# Patient Record
Sex: Male | Born: 1990 | Race: Black or African American | Hispanic: No | Marital: Single | Smoking: Former smoker
Health system: Southern US, Community
[De-identification: ages and names within clinical notes are randomized; demographics above are authoritative.]

## PROBLEM LIST (undated history)

## (undated) DIAGNOSIS — R1115 Cyclical vomiting syndrome unrelated to migraine: Secondary | ICD-10-CM

## (undated) DIAGNOSIS — J020 Streptococcal pharyngitis: Secondary | ICD-10-CM

---

## 2012-08-13 DIAGNOSIS — F1210 Cannabis abuse, uncomplicated: Secondary | ICD-10-CM | POA: Insufficient documentation

## 2012-09-13 DIAGNOSIS — F29 Unspecified psychosis not due to a substance or known physiological condition: Secondary | ICD-10-CM | POA: Insufficient documentation

## 2012-09-13 DIAGNOSIS — F411 Generalized anxiety disorder: Secondary | ICD-10-CM | POA: Insufficient documentation

## 2013-04-27 DIAGNOSIS — R1115 Cyclical vomiting syndrome unrelated to migraine: Secondary | ICD-10-CM | POA: Insufficient documentation

## 2013-05-08 DIAGNOSIS — R1115 Cyclical vomiting syndrome unrelated to migraine: Secondary | ICD-10-CM | POA: Insufficient documentation

## 2013-12-11 ENCOUNTER — Emergency Department: Payer: Self-pay | Admitting: Emergency Medicine

## 2013-12-11 LAB — COMPREHENSIVE METABOLIC PANEL
ANION GAP: 10 (ref 7–16)
Albumin: 4.3 g/dL (ref 3.4–5.0)
Alkaline Phosphatase: 74 U/L
BILIRUBIN TOTAL: 0.5 mg/dL (ref 0.2–1.0)
BUN: 5 mg/dL — AB (ref 7–18)
CO2: 21 mmol/L (ref 21–32)
Calcium, Total: 9.5 mg/dL (ref 8.5–10.1)
Chloride: 107 mmol/L (ref 98–107)
Creatinine: 0.72 mg/dL (ref 0.60–1.30)
EGFR (African American): >60
GLUCOSE: 109 mg/dL — AB (ref 65–99)
Osmolality: 274 (ref 275–301)
POTASSIUM: 3.8 mmol/L (ref 3.5–5.1)
SGOT(AST): 42 U/L — ABNORMAL HIGH (ref 15–37)
SGPT (ALT): 37 U/L (ref 12–78)
SODIUM: 138 mmol/L (ref 136–145)
Total Protein: 8.6 g/dL — ABNORMAL HIGH (ref 6.4–8.2)

## 2013-12-11 LAB — CBC WITH DIFFERENTIAL/PLATELET
Basophil #: 0.1 10*3/uL (ref 0.0–0.1)
Basophil %: 0.8 %
EOS ABS: 0.1 10*3/uL (ref 0.0–0.7)
Eosinophil %: 0.8 %
HCT: 44.8 % (ref 40.0–52.0)
HGB: 15.2 g/dL (ref 13.0–18.0)
LYMPHS ABS: 1.3 10*3/uL (ref 1.0–3.6)
LYMPHS PCT: 18.8 %
MCH: 29.8 pg (ref 26.0–34.0)
MCHC: 33.9 g/dL (ref 32.0–36.0)
MCV: 88 fL (ref 80–100)
Monocyte #: 0.5 x10 3/mm (ref 0.2–1.0)
Monocyte %: 6.9 %
NEUTROS PCT: 72.7 %
Neutrophil #: 4.9 10*3/uL (ref 1.4–6.5)
PLATELETS: 273 10*3/uL (ref 150–440)
RBC: 5.1 10*6/uL (ref 4.40–5.90)
RDW: 13.1 % (ref 11.5–14.5)
WBC: 6.7 10*3/uL (ref 3.8–10.6)

## 2013-12-11 LAB — TROPONIN I

## 2013-12-11 LAB — LIPASE, BLOOD: LIPASE: 138 U/L (ref 73–393)

## 2013-12-19 DIAGNOSIS — R0789 Other chest pain: Secondary | ICD-10-CM | POA: Insufficient documentation

## 2014-09-23 ENCOUNTER — Emergency Department: Payer: Self-pay | Admitting: Emergency Medicine

## 2016-04-16 ENCOUNTER — Encounter (HOSPITAL_COMMUNITY): Payer: Self-pay | Admitting: Emergency Medicine

## 2016-04-16 DIAGNOSIS — E876 Hypokalemia: Secondary | ICD-10-CM | POA: Insufficient documentation

## 2016-04-16 DIAGNOSIS — K920 Hematemesis: Secondary | ICD-10-CM | POA: Insufficient documentation

## 2016-04-16 NOTE — ED Triage Notes (Signed)
Pt. Reports central chest and generalized abdominal pain with emesis and constipation onset last week , denies SOB or diarrhea .

## 2016-04-17 ENCOUNTER — Emergency Department (HOSPITAL_COMMUNITY)
Admission: EM | Admit: 2016-04-17 | Discharge: 2016-04-17 | Disposition: A | Discharge status: Home / Self Care | Payer: Self-pay | Attending: Emergency Medicine | Admitting: Emergency Medicine

## 2016-04-17 ENCOUNTER — Emergency Department (HOSPITAL_COMMUNITY): Payer: Self-pay

## 2016-04-17 ENCOUNTER — Encounter (HOSPITAL_COMMUNITY): Payer: Self-pay | Admitting: Radiology

## 2016-04-17 DIAGNOSIS — R1084 Generalized abdominal pain: Secondary | ICD-10-CM

## 2016-04-17 DIAGNOSIS — E876 Hypokalemia: Secondary | ICD-10-CM

## 2016-04-17 DIAGNOSIS — R112 Nausea with vomiting, unspecified: Secondary | ICD-10-CM

## 2016-04-17 DIAGNOSIS — K920 Hematemesis: Secondary | ICD-10-CM

## 2016-04-17 LAB — URINALYSIS, ROUTINE W REFLEX MICROSCOPIC
Glucose, UA: NEGATIVE mg/dL
Ketones, ur: >80 mg/dL — AB
Nitrite: NEGATIVE
Protein, ur: 100 mg/dL — AB
Specific Gravity, Urine: 1.028 (ref 1.005–1.030)
pH: 6.5 (ref 5.0–8.0)

## 2016-04-17 LAB — CBC
HCT: 46.1 % (ref 39.0–52.0)
Hemoglobin: 16.2 g/dL (ref 13.0–17.0)
MCH: 29.8 pg (ref 26.0–34.0)
MCHC: 35.1 g/dL (ref 30.0–36.0)
MCV: 84.7 fL (ref 78.0–100.0)
Platelets: 501 10*3/uL — ABNORMAL HIGH (ref 150–400)
RBC: 5.44 MIL/uL (ref 4.22–5.81)
RDW: 12.3 % (ref 11.5–15.5)
WBC: 15.9 10*3/uL — ABNORMAL HIGH (ref 4.0–10.5)

## 2016-04-17 LAB — BASIC METABOLIC PANEL
Anion gap: 16 — ABNORMAL HIGH (ref 5–15)
BUN: 14 mg/dL (ref 6–20)
CO2: 25 mmol/L (ref 22–32)
Calcium: 10.2 mg/dL (ref 8.9–10.3)
Chloride: 91 mmol/L — ABNORMAL LOW (ref 101–111)
Creatinine, Ser: 1.07 mg/dL (ref 0.61–1.24)
GFR calc Af Amer: >60 mL/min (ref >60)
GFR calc non Af Amer: >60 mL/min (ref >60)
Glucose, Bld: 101 mg/dL — ABNORMAL HIGH (ref 65–99)
Potassium: 2.9 mmol/L — ABNORMAL LOW (ref 3.5–5.1)
Sodium: 132 mmol/L — ABNORMAL LOW (ref 135–145)

## 2016-04-17 LAB — LIPASE, BLOOD: Lipase: 36 U/L (ref 11–51)

## 2016-04-17 LAB — URINE MICROSCOPIC-ADD ON

## 2016-04-17 LAB — I-STAT TROPONIN, ED: Troponin i, poc: 0 ng/mL (ref 0.00–0.08)

## 2016-04-17 MED ORDER — DIPHENHYDRAMINE HCL 50 MG/ML IJ SOLN
25.0000 mg | Freq: Once | INTRAMUSCULAR | Status: AC
  Administered 2016-04-17: 25 mg via INTRAVENOUS
  Filled 2016-04-17: qty 1

## 2016-04-17 MED ORDER — RANITIDINE HCL 150 MG PO TABS: 150.0000 mg | ORAL_TABLET | Freq: Two times a day (BID) | ORAL | 0 refills | Status: DC

## 2016-04-17 MED ORDER — IOPAMIDOL (ISOVUE-300) INJECTION 61%
INTRAVENOUS | Status: AC
  Administered 2016-04-17: 100 mL
  Filled 2016-04-17: qty 100

## 2016-04-17 MED ORDER — SODIUM CHLORIDE 0.9 % IV BOLUS (SEPSIS)
1000.0000 mL | Freq: Once | INTRAVENOUS | Status: AC
  Administered 2016-04-17: 1000 mL via INTRAVENOUS

## 2016-04-17 MED ORDER — DOCUSATE SODIUM 100 MG PO CAPS: 100.0000 mg | ORAL_CAPSULE | Freq: Two times a day (BID) | ORAL | 0 refills | Status: DC

## 2016-04-17 MED ORDER — METOCLOPRAMIDE HCL 5 MG/ML IJ SOLN
10.0000 mg | Freq: Once | INTRAMUSCULAR | Status: AC
  Administered 2016-04-17: 10 mg via INTRAVENOUS
  Filled 2016-04-17: qty 2

## 2016-04-17 MED ORDER — POTASSIUM CHLORIDE CRYS ER 20 MEQ PO TBCR: 40.0000 meq | EXTENDED_RELEASE_TABLET | Freq: Every day | ORAL | 0 refills | Status: DC

## 2016-04-17 MED ORDER — PROMETHAZINE HCL 25 MG PO TABS: 25.0000 mg | ORAL_TABLET | Freq: Four times a day (QID) | ORAL | 0 refills | Status: DC | PRN

## 2016-04-17 NOTE — ED Notes (Signed)
Pt provided with d/c instructions at this time.  Pt verbalizes understanding of d/c instructions as well as follow up procedure after d/c.  Pt provided with RX for zantac, phenergan, colace, and potassium chloride.  Pt verbalizes understanding of RX directions. Pt in no apparent distress at this time.  Pt ambulatory at time of d/c.

## 2016-04-17 NOTE — ED Provider Notes (Signed)
[facility]-EMERGENCY DEPT Provider Note   CSN: [id number] Arrival date & time: 04/16/16  2333  By signing my name below, I, Clovis PuAvnee Patel, attest that this documentation has been prepared under the direction and in the presence of Gilda Creasehristopher J Lucero Ide, MD  Electronically Signed: Clovis PuAvnee Patel, ED Scribe. 04/17/16. 1:13 AM.    History   Chief Complaint Chief Complaint  Patient presents with  . Chest Pain  . Abdominal Pain    The history is provided by the patient. No language interpreter was used.    HPI Comments:  James Sanchez is a 25 y.o. male who presents to the Emergency Department complaining of constant, gradual worsening, moderate generalized abdominal pain onset one week ago. Pt has associated left sided chest pain, nausea, vomiting and diarrhea. Pt further reports hematemesis and constipation. Per pt, he is unable to eat or drink any fluids without vomiting. His mother reports giving him an enema to help with his constipation that has not given him any relief. She notes pt has a hx of abdominal problems in the past. He has no other complaints at this time. No alleviating factors noted.    History reviewed. No pertinent past medical history.  There are no active problems to display for this patient.   History reviewed. No pertinent surgical history.     Home Medications    Prior to Admission medications   Medication Sig Start Date End Date Taking? Authorizing Provider  docusate sodium (COLACE) 100 MG capsule Take 1 capsule (100 mg total) by mouth every 12 (twelve) hours. 04/17/16   Gilda Creasehristopher J Daimion Adamcik, MD  potassium chloride SA (K-DUR,KLOR-CON) 20 MEQ tablet Take 2 tablets (40 mEq total) by mouth daily. 04/17/16   Gilda Creasehristopher J Garrus Gauthreaux, MD  promethazine (PHENERGAN) 25 MG tablet Take 1 tablet (25 mg total) by mouth every 6 (six) hours as needed for nausea or vomiting. 04/17/16   Gilda Creasehristopher J Douglas Smolinsky, MD  ranitidine (ZANTAC) 150 MG tablet Take 1 tablet (150 mg total) by mouth 2  (two) times daily. 04/17/16   Gilda Creasehristopher J Maan Zarcone, MD    Family History No family history on file.  Social History Social History  Substance Use Topics  . Smoking status: Never Smoker  . Smokeless tobacco: Never Used  . Alcohol use No     Allergies   Aspirin and Sulfa antibiotics   Review of Systems Review of Systems  Cardiovascular: Positive for chest pain.  Gastrointestinal: Positive for abdominal pain, diarrhea and vomiting.  All other systems reviewed and are negative.    Physical Exam Updated Vital Signs BP 139/98 (BP Location: Right Arm)   Pulse 94   Temp 99.6 F (37.6 C) (Oral)   Resp 16   Ht 5\' 8"  (1.727 m)   Wt 251 lb (113.9 kg)   SpO2 97%   BMI 38.16 kg/m   Physical Exam  Constitutional: He is oriented to person, place, and time. He appears well-developed and well-nourished. No distress.  HENT:  Head: Normocephalic and atraumatic.  Right Ear: Hearing normal.  Left Ear: Hearing normal.  Nose: Nose normal.  Mouth/Throat: Oropharynx is clear and moist and mucous membranes are normal.  Eyes: Conjunctivae and EOM are normal. Pupils are equal, round, and reactive to light.  Neck: Normal range of motion. Neck supple.  Cardiovascular: Regular rhythm, S1 normal and S2 normal.  Exam reveals no gallop and no friction rub.   No murmur heard. Pulmonary/Chest: Effort normal and breath sounds normal. No respiratory distress. He exhibits no tenderness.  Abdominal: Soft. Normal appearance and bowel sounds are normal. There is no hepatosplenomegaly. There is tenderness. There is no rebound, no guarding, no tenderness at McBurney's point and negative Murphy's sign. No hernia.  Diffuse tenderness   Musculoskeletal: Normal range of motion.  Neurological: He is alert and oriented to person, place, and time. He has normal strength. No cranial nerve deficit or sensory deficit. Coordination normal. GCS eye subscore is 4. GCS verbal subscore is 5. GCS motor subscore is 6.    Skin: Skin is warm, dry and intact. No rash noted. No cyanosis.  Psychiatric: He has a normal mood and affect. His speech is normal and behavior is normal. Thought content normal.  Nursing note and vitals reviewed.    ED Treatments / Results  DIAGNOSTIC STUDIES:  Oxygen Saturation is 97% on room air, normal by my interpretation.    COORDINATION OF CARE:  1:05 AM Discussed treatment plan with pt at bedside and pt agreed to plan.  Labs (all labs ordered are listed, but only abnormal results are displayed) Labs Reviewed  BASIC METABOLIC PANEL - Abnormal; Notable for the following:       Result Value   Sodium 132 (*)    Potassium 2.9 (*)    Chloride 91 (*)    Glucose, Bld 101 (*)    Anion gap 16 (*)    All other components within normal limits  CBC - Abnormal; Notable for the following:    WBC 15.9 (*)    Platelets 501 (*)    All other components within normal limits  URINALYSIS, ROUTINE W REFLEX MICROSCOPIC (NOT AT [facility]) - Abnormal; Notable for the following:    Color, Urine AMBER (*)    APPearance CLOUDY (*)    Hgb urine dipstick TRACE (*)    Bilirubin Urine MODERATE (*)    Ketones, ur >80 (*)    Protein, ur 100 (*)    Leukocytes, UA SMALL (*)    All other components within normal limits  URINE MICROSCOPIC-ADD ON - Abnormal; Notable for the following:    Squamous Epithelial / LPF 6-30 (*)    Bacteria, UA FEW (*)    Casts HYALINE CASTS (*)    All other components within normal limits  LIPASE, BLOOD  I-STAT TROPOININ, ED    EKG  EKG Interpretation None       Radiology Dg Chest 2 View  Result Date: 04/17/2016 CLINICAL DATA:  Acute onset of mid chest pain, nausea and vomiting. Body aches and cough. Initial encounter. EXAM: CHEST  2 VIEW COMPARISON:  None. FINDINGS: The lungs are well-aerated and clear. There is no evidence of focal opacification, pleural effusion or pneumothorax. The heart is normal in size; the mediastinal contour is within normal limits. No  acute osseous abnormalities are seen. IMPRESSION: No acute cardiopulmonary process seen. Electronically Signed   By: Roanna Raider M.D.   On: 04/17/2016 00:12   Ct Abdomen Pelvis W Contrast  Result Date: 04/17/2016 CLINICAL DATA:  25 year old male with abdominal pain. EXAM: CT ABDOMEN AND PELVIS WITH CONTRAST TECHNIQUE: Multidetector CT imaging of the abdomen and pelvis was performed using the standard protocol following bolus administration of intravenous contrast. CONTRAST:  ISOVUE-300 IOPAMIDOL (ISOVUE-300) INJECTION 61% COMPARISON:  Abdominal radiograph dated 12/11/2013 FINDINGS: Evaluation of this exam is limited due to respiratory motion artifact. The visualized lung bases are clear. No intra-abdominal free air or free fluid. The liver, gallbladder, pancreas, spleen, adrenal glands, kidneys, visualized ureters, and urinary bladder appear unremarkable. The  prostate and seminal vesicles are grossly unremarkable. There is no evidence of bowel obstruction or active inflammation. The appendix is not visualized with certainty. No inflammatory changes identified in the right lower quadrant. The abdominal aorta and IVC appear unremarkable. No portal venous gas identified. There is no adenopathy. The abdominal wall soft tissues appear unremarkable. Bilateral L5 pars defects. No listhesis. No acute osseous pathology. IMPRESSION: No acute intra-abdominal pelvic pathology. Electronically Signed   By: Elgie Collard M.D.   On: 04/17/2016 02:21    Procedures Procedures (including critical care time)  Medications Ordered in ED Medications  sodium chloride 0.9 % bolus 1,000 mL (1,000 mLs Intravenous New Bag/Given 04/17/16 0130)    Followed by  sodium chloride 0.9 % bolus 1,000 mL (1,000 mLs Intravenous New Bag/Given 04/17/16 0131)  metoCLOPramide (REGLAN) injection 10 mg (10 mg Intravenous Given 04/17/16 0130)  diphenhydrAMINE (BENADRYL) injection 25 mg (25 mg Intravenous Given 04/17/16 0130)  iopamidol  (ISOVUE-300) 61 % injection (100 mLs  Contrast Given 04/17/16 0151)     Initial Impression / Assessment and Plan / ED Course  I have reviewed the triage vital signs and the nursing notes.  Pertinent labs & imaging results that were available during my care of the patient were reviewed by me and considered in my medical decision making (see chart for details).  Clinical Course    Patient presents with complaints of nausea and vomiting for 1 week. He reports that he has noticed blood in his vomit. He has not been able to hold anything down because of nausea and vomiting. He does feel like he is constipated, has been attempting enemas without improvement.  Laboratory reveals leukocytosis and mild hypokalemia. There was a very slight anion gap present. His abdominal exam, however, was fairly benign. He had tenderness diffusely, but there was no signs of acute peritonitis. Because of his diffuse pain as well as elevated white blood cell count, CT scan with contrast was performed. No acute abnormality is noted. Patient was aggressively hydrated here in the ER. He'll require potassium supplementation and symptomatically treatment, follow-up with GI. He reports that he did notice some blood in his vomit. This was likely secondary to history of a healing, as he is hemodynamically stable and his hemoglobin is normal. Will prescribe H2 blocker.  Final Clinical Impressions(s) / ED Diagnoses   Final diagnoses:  Generalized abdominal pain  Nausea and vomiting, vomiting of unspecified type  Hematemesis with nausea  Hypokalemia    New Prescriptions New Prescriptions   DOCUSATE SODIUM (COLACE) 100 MG CAPSULE    Take 1 capsule (100 mg total) by mouth every 12 (twelve) hours.   POTASSIUM CHLORIDE SA (K-DUR,KLOR-CON) 20 MEQ TABLET    Take 2 tablets (40 mEq total) by mouth daily.   PROMETHAZINE (PHENERGAN) 25 MG TABLET    Take 1 tablet (25 mg total) by mouth every 6 (six) hours as needed for nausea or  vomiting.   RANITIDINE (ZANTAC) 150 MG TABLET    Take 1 tablet (150 mg total) by mouth 2 (two) times daily.  I personally performed the services described in this documentation, which was scribed in my presence. The recorded information has been reviewed and is accurate.     Gilda Crease, MD 04/17/16 [phone]

## 2016-04-21 LAB — HM HIV SCREENING LAB: HM HIV Screening: NEGATIVE

## 2016-04-26 ENCOUNTER — Emergency Department
Admission: EM | Admit: 2016-04-26 | Discharge: 2016-04-26 | Disposition: A | Discharge status: Home / Self Care | Payer: Self-pay | Attending: Emergency Medicine | Admitting: Emergency Medicine

## 2016-04-26 ENCOUNTER — Encounter: Payer: Self-pay | Admitting: Emergency Medicine

## 2016-04-26 ENCOUNTER — Emergency Department: Payer: Self-pay

## 2016-04-26 DIAGNOSIS — R109 Unspecified abdominal pain: Secondary | ICD-10-CM | POA: Insufficient documentation

## 2016-04-26 DIAGNOSIS — R112 Nausea with vomiting, unspecified: Secondary | ICD-10-CM | POA: Insufficient documentation

## 2016-04-26 DIAGNOSIS — Z5181 Encounter for therapeutic drug level monitoring: Secondary | ICD-10-CM | POA: Insufficient documentation

## 2016-04-26 LAB — URINE DRUG SCREEN, QUALITATIVE (ARMC ONLY)
AMPHETAMINES, UR SCREEN: NOT DETECTED
BARBITURATES, UR SCREEN: NOT DETECTED
BENZODIAZEPINE, UR SCRN: NOT DETECTED
COCAINE METABOLITE, UR ~~LOC~~: NOT DETECTED
Cannabinoid 50 Ng, Ur ~~LOC~~: POSITIVE — AB
MDMA (Ecstasy)Ur Screen: NOT DETECTED
Methadone Scn, Ur: NOT DETECTED
OPIATE, UR SCREEN: NOT DETECTED
PHENCYCLIDINE (PCP) UR S: NOT DETECTED
Tricyclic, Ur Screen: NOT DETECTED

## 2016-04-26 LAB — URINALYSIS COMPLETE WITH MICROSCOPIC (ARMC ONLY)
Glucose, UA: NEGATIVE mg/dL
HGB URINE DIPSTICK: NEGATIVE
Nitrite: NEGATIVE
PH: 6 (ref 5.0–8.0)
Protein, ur: 100 mg/dL — AB
Specific Gravity, Urine: 1.027 (ref 1.005–1.030)

## 2016-04-26 LAB — BASIC METABOLIC PANEL
ANION GAP: 12 (ref 5–15)
BUN: 9 mg/dL (ref 6–20)
CALCIUM: 9.8 mg/dL (ref 8.9–10.3)
CO2: 23 mmol/L (ref 22–32)
CREATININE: 1.08 mg/dL (ref 0.61–1.24)
Chloride: 98 mmol/L — ABNORMAL LOW (ref 101–111)
Glucose, Bld: 91 mg/dL (ref 65–99)
Potassium: 3.3 mmol/L — ABNORMAL LOW (ref 3.5–5.1)
SODIUM: 133 mmol/L — AB (ref 135–145)

## 2016-04-26 LAB — CBC
HCT: 43.3 % (ref 40.0–52.0)
HEMOGLOBIN: 15.3 g/dL (ref 13.0–18.0)
MCH: 29.5 pg (ref 26.0–34.0)
MCHC: 35.3 g/dL (ref 32.0–36.0)
MCV: 83.6 fL (ref 80.0–100.0)
PLATELETS: 400 10*3/uL (ref 150–440)
RBC: 5.18 MIL/uL (ref 4.40–5.90)
RDW: 12.9 % (ref 11.5–14.5)
WBC: 9 10*3/uL (ref 3.8–10.6)

## 2016-04-26 LAB — TROPONIN I: Troponin I: <0.03 ng/mL (ref <0.03)

## 2016-04-26 LAB — LIPASE, BLOOD: LIPASE: 28 U/L (ref 11–51)

## 2016-04-26 MED ORDER — ONDANSETRON HCL 4 MG/2ML IJ SOLN
INTRAMUSCULAR | Status: AC
  Filled 2016-04-26: qty 2

## 2016-04-26 MED ORDER — SODIUM CHLORIDE 0.9 % IV BOLUS (SEPSIS)
1000.0000 mL | Freq: Once | INTRAVENOUS | Status: AC
  Administered 2016-04-26: 1000 mL via INTRAVENOUS
  Filled 2016-04-26: qty 1000

## 2016-04-26 MED ORDER — ONDANSETRON HCL 4 MG PO TABS: 4.0000 mg | ORAL_TABLET | Freq: Three times a day (TID) | ORAL | 0 refills | Status: DC | PRN

## 2016-04-26 MED ORDER — METOCLOPRAMIDE HCL 5 MG/ML IJ SOLN
10.0000 mg | Freq: Once | INTRAMUSCULAR | Status: AC
  Administered 2016-04-26: 10 mg via INTRAVENOUS
  Filled 2016-04-26: qty 2

## 2016-04-26 MED ORDER — LORAZEPAM 1 MG PO TABS: 1.0000 mg | ORAL_TABLET | Freq: Two times a day (BID) | ORAL | 0 refills | Status: DC | PRN

## 2016-04-26 MED ORDER — ONDANSETRON HCL 4 MG/2ML IJ SOLN
4.0000 mg | Freq: Once | INTRAMUSCULAR | Status: AC | PRN
  Administered 2016-04-26: 4 mg via INTRAVENOUS

## 2016-04-26 MED ORDER — LORAZEPAM 2 MG/ML IJ SOLN
1.0000 mg | Freq: Once | INTRAMUSCULAR | Status: AC
  Administered 2016-04-26: 1 mg via INTRAVENOUS
  Filled 2016-04-26: qty 1

## 2016-04-26 NOTE — ED Provider Notes (Signed)
Time Seen: Approximately 0153 I have reviewed the triage notes  Chief Complaint: Chest Pain and Emesis   History of Present Illness: James Sanchez is a 25 y.o. male who states a history recently of chest pain with vomiting over the last 3 weeks. Patient minutes has been seen at numerous local hospitals. He apparently is also had this issue in the past where he gets persistent vomiting. No obvious new complaints and most of his history and review of systems is taken through his mother as the patient will refuse to answer questions when addressed directly. No obvious fever at home though his mom states that he will get sweats at night. She's not witnessed any obvious blood. He has numerous bottles of Nexium and other proton pump inhibitor therapy's. He also has prescriptions for Zofran which the bottle is empty along with Phenergan suppository   History reviewed. No pertinent past medical history.  There are no active problems to display for this patient.   History reviewed. No pertinent surgical history.  History reviewed. No pertinent surgical history.  Current Outpatient Rx  . Order #: [id number] Class: Print  . Order #: [id number] Class: Print  . Order #: [id number] Class: Print  . Order #: [id number] Class: Print  . Order #: [id number] Class: Print  . Order #: [id number] Class: Print    Allergies:  Aspirin and Sulfa antibiotics  Family History: History reviewed. No pertinent family history.  Social History: Social History  Substance Use Topics  . Smoking status: Never Smoker  . Smokeless tobacco: Never Used  . Alcohol use No     Review of Systems:   10 point review of systems was performed and was otherwise negative:  Constitutional: No fever Eyes: No visual disturbances ENT: No sore throat, ear pain Cardiac: No chest pain Respiratory: No shortness of breath, wheezing, or stridor Abdomen: No abdominal pain, no vomiting, No diarrhea Endocrine: No weight loss, No night  sweats Extremities: No peripheral edema, cyanosis Skin: No rashes, easy bruising Neurologic: No focal weakness, trouble with speech or swollowing Urologic: No dysuria, Hematuria, or urinary frequency   Physical Exam:  ED Triage Vitals  Enc Vitals Group     BP 04/26/16 0131 (!) 152/107     Pulse Rate 04/26/16 0131 (!) 112     Resp 04/26/16 0131 19     Temp 04/26/16 0131 98.8 F (37.1 C)     Temp Source 04/26/16 0131 Oral     SpO2 04/26/16 0131 99 %     Weight 04/26/16 0132 250 lb (113.4 kg)     Height 04/26/16 0132 5\' 8"  (1.727 m)     Head Circumference --      Peak Flow --      Pain Score 04/26/16 0132 10     Pain Loc --      Pain Edu? --      Excl. in GC? --     General: Awake , Alert , and Oriented times 3; GCS 15 Head: Normal cephalic , atraumatic Eyes: Pupils equal , round, reactive to light Nose/Throat: No nasal drainage, patent upper airway without erythema or exudate.  Neck: Supple, Full range of motion, No anterior adenopathy or palpable thyroid masses Lungs: Clear to ascultation without wheezes , rhonchi, or rales Heart: Regular rate, regular rhythm without murmurs , gallops , or rubs Abdomen: Soft, non tender without rebound, guarding , or rigidity; bowel sounds positive and symmetric in all 4 quadrants. No organomegaly .        Extremities: 2  plus symmetric pulses. No edema, clubbing or cyanosis Neurologic: normal ambulation, Motor symmetric without deficits, sensory intact Skin: warm, dry, no rashes   Labs:   All laboratory work was reviewed including any pertinent negatives or positives listed below:  Labs Reviewed  BASIC METABOLIC PANEL - Abnormal; Notable for the following:       Result Value   Sodium 133 (*)    Potassium 3.3 (*)    Chloride 98 (*)    All other components within normal limits  URINALYSIS COMPLETEWITH MICROSCOPIC ([facility] ONLY) - Abnormal; Notable for the following:    Color, Urine AMBER (*)    APPearance CLEAR (*)    Bilirubin Urine  1+ (*)    Ketones, ur 2+ (*)    Protein, ur 100 (*)    Leukocytes, UA TRACE (*)    Bacteria, UA RARE (*)    Squamous Epithelial / LPF 0-5 (*)    All other components within normal limits  URINE DRUG SCREEN, QUALITATIVE ([facility] ONLY) - Abnormal; Notable for the following:    Cannabinoid 50 Ng, Ur [place] POSITIVE (*)    All other components within normal limits  CBC  TROPONIN I  LIPASE, BLOOD    EKG: ED ECG REPORT I, Jennye Moccasin, the attending physician, personally viewed and interpreted this ECG.  Date: 04/26/2016 EKG Time: 0134 Rate: 114 Rhythm: normal sinus rhythm QRS Axis: normal Intervals: normal ST/T Wave abnormalities: normal Conduction Disturbances: none Narrative Interpretation: unremarkable Poor R-wave progression otherwise no significant findings. No acute ischemic changes   Radiology: "Dg Chest 2 View  Result Date: 04/26/2016 CLINICAL DATA:  Subacute onset of generalized chest pain and vomiting. Shortness of breath. Initial encounter. EXAM: CHEST  2 VIEW COMPARISON:  Chest radiograph performed 04/16/2016 FINDINGS: The lungs are well-aerated and clear. There is no evidence of focal opacification, pleural effusion or pneumothorax. The heart is normal in size; the mediastinal contour is within normal limits. No acute osseous abnormalities are seen. IMPRESSION: No acute cardiopulmonary process seen. Electronically Signed   By: Roanna Raider M.D.   On: 04/26/2016 02:25   Dg Chest 2 View  Result Date: 04/17/2016 CLINICAL DATA:  Acute onset of mid chest pain, nausea and vomiting. Body aches and cough. Initial encounter. EXAM: CHEST  2 VIEW COMPARISON:  None. FINDINGS: The lungs are well-aerated and clear. There is no evidence of focal opacification, pleural effusion or pneumothorax. The heart is normal in size; the mediastinal contour is within normal limits. No acute osseous abnormalities are seen. IMPRESSION: No acute cardiopulmonary process seen. Electronically Signed   By:  Roanna Raider M.D.   On: 04/17/2016 00:12   Ct Abdomen Pelvis W Contrast  Result Date: 04/17/2016 CLINICAL DATA:  25 year old male with abdominal pain. EXAM: CT ABDOMEN AND PELVIS WITH CONTRAST TECHNIQUE: Multidetector CT imaging of the abdomen and pelvis was performed using the standard protocol following bolus administration of intravenous contrast. CONTRAST:  ISOVUE-300 IOPAMIDOL (ISOVUE-300) INJECTION 61% COMPARISON:  Abdominal radiograph dated 12/11/2013 FINDINGS: Evaluation of this exam is limited due to respiratory motion artifact. The visualized lung bases are clear. No intra-abdominal free air or free fluid. The liver, gallbladder, pancreas, spleen, adrenal glands, kidneys, visualized ureters, and urinary bladder appear unremarkable. The prostate and seminal vesicles are grossly unremarkable. There is no evidence of bowel obstruction or active inflammation. The appendix is not visualized with certainty. No inflammatory changes identified in the right lower quadrant. The abdominal aorta and IVC appear unremarkable. No portal venous gas identified.  There is no adenopathy. The abdominal wall soft tissues appear unremarkable. Bilateral L5 pars defects. No listhesis. No acute osseous pathology. IMPRESSION: No acute intra-abdominal pelvic pathology. Electronically Signed   By: Elgie Collard M.D.   On: 04/17/2016 02:21   Ct Renal Stone Study  Result Date: 04/26/2016 CLINICAL DATA:  Acute onset of generalized abdominal pain. Initial encounter. EXAM: CT ABDOMEN AND PELVIS WITHOUT CONTRAST TECHNIQUE: Multidetector CT imaging of the abdomen and pelvis was performed following the standard protocol without IV contrast. COMPARISON:  CT of the abdomen and pelvis from 04/17/2016 FINDINGS: Lower chest: The visualized lung bases are grossly clear. The visualized portions of the mediastinum are unremarkable. Hepatobiliary: The liver is unremarkable in appearance. The gallbladder is unremarkable in appearance.  The common bile duct remains normal in caliber. Pancreas: The pancreas is within normal limits. Spleen: The spleen is unremarkable in appearance. Adrenals/Urinary Tract: The adrenal glands are unremarkable in appearance. The kidneys are within normal limits. There is no evidence of hydronephrosis. No renal or ureteral stones are identified. No perinephric stranding is seen. Stomach/Bowel: The stomach is unremarkable in appearance. The small bowel is within normal limits. The appendix is normal in caliber, without evidence of appendicitis. The colon is unremarkable in appearance. Vascular/Lymphatic: The abdominal aorta is unremarkable in appearance, though not well assessed without contrast. The inferior vena cava is grossly unremarkable. No retroperitoneal lymphadenopathy is seen. No pelvic sidewall lymphadenopathy is identified. Reproductive: The bladder is decompressed and not well assessed. The prostate remains normal in size. Other: No additional soft tissue abnormalities are seen. Musculoskeletal: No acute osseous abnormalities are identified. Chronic bilateral pars defects are seen at L5, without evidence of anterolisthesis. The visualized musculature is unremarkable in appearance. IMPRESSION: 1. No acute abnormality seen to explain the patient's symptoms. 2. 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 Electronically Signed   By: Roanna Raider M.D.   On: 04/26/2016 02:56  "  I personally reviewed the radiologic studies    ED Course: Patient was given IV fluids and some IV Zofran, IV Ativan while here in emergency department. He did not have any episodes of emesis here. Patient was advised to stop smoking marijuana as this can create cyclic vomiting. I also had a long discussion with his mother at the bedside that he needs to progress in his follow-up and then bouncing from one emergency department to the other will not receive any progress in his evaluation. He may need  an upper endoscopic exam, etc. His laboratory work, x-ray evaluation in CAT scan evaluation not show any etiology require inpatient management   Clinical Course     Assessment:  Protracted nausea and vomiting of unknown etiology   Final Clinical Impression:  Final diagnoses:  Abdominal pain  Non-intractable vomiting with nausea, vomiting of unspecified type     Plan:  Outpatient " Discharge Medication List as of 04/26/2016  3:33 AM    START taking these medications   Details  LORazepam (ATIVAN) 1 MG tablet Take 1 tablet (1 mg total) by mouth 2 (two) times daily as needed for anxiety., Starting Sat 04/26/2016, Print    ondansetron (ZOFRAN) 4 MG tablet Take 1 tablet (4 mg total) by mouth every 8 (eight) hours as needed for nausea or vomiting., Starting Sat 04/26/2016, Print      " Patient was advised to return immediately if condition worsens. Patient was advised to follow up with their primary care physician or other specialized physicians involved in their outpatient  care. The patient and/or family member/power of attorney had laboratory results reviewed at the bedside. All questions and concerns were addressed and appropriate discharge instructions were distributed by the nursing staff. Jennye Moccasin, MD 04/26/16 [phone]

## 2016-04-26 NOTE — ED Notes (Signed)
Patient returned from XR/CT.

## 2016-04-26 NOTE — ED Notes (Signed)
Patient transported to X-ray 

## 2016-04-26 NOTE — Discharge Instructions (Signed)
You need to follow up with a gastroenterologist to evaluate for possible endoscopic.

## 2016-04-26 NOTE — ED Triage Notes (Signed)
Chest pain and vomiting for 3 weeks. States he has been to numerous hospitals and told he was fine. Says sometimes he gets short of breath.

## 2016-04-29 DIAGNOSIS — E872 Acidosis: Secondary | ICD-10-CM | POA: Insufficient documentation

## 2016-04-29 DIAGNOSIS — E8729 Other acidosis: Secondary | ICD-10-CM | POA: Insufficient documentation

## 2016-04-30 DIAGNOSIS — F12988 Cannabis use, unspecified with other cannabis-induced disorder: Secondary | ICD-10-CM

## 2016-04-30 DIAGNOSIS — F1290 Cannabis use, unspecified, uncomplicated: Secondary | ICD-10-CM | POA: Insufficient documentation

## 2016-06-05 DIAGNOSIS — E876 Hypokalemia: Secondary | ICD-10-CM | POA: Insufficient documentation

## 2016-06-05 DIAGNOSIS — E861 Hypovolemia: Secondary | ICD-10-CM | POA: Insufficient documentation

## 2018-10-01 ENCOUNTER — Emergency Department: Payer: Self-pay

## 2018-10-01 ENCOUNTER — Emergency Department
Admission: EM | Admit: 2018-10-01 | Discharge: 2018-10-01 | Disposition: A | Discharge status: Home / Self Care | Payer: Self-pay | Attending: Emergency Medicine | Admitting: Emergency Medicine

## 2018-10-01 ENCOUNTER — Encounter: Payer: Self-pay | Admitting: Emergency Medicine

## 2018-10-01 DIAGNOSIS — J020 Streptococcal pharyngitis: Secondary | ICD-10-CM | POA: Insufficient documentation

## 2018-10-01 LAB — COMPREHENSIVE METABOLIC PANEL
ALBUMIN: 4.2 g/dL (ref 3.5–5.0)
ALT: 69 U/L — ABNORMAL HIGH (ref 0–44)
AST: 106 U/L — AB (ref 15–41)
Alkaline Phosphatase: 103 U/L (ref 38–126)
Anion gap: 17 — ABNORMAL HIGH (ref 5–15)
BUN: 20 mg/dL (ref 6–20)
CHLORIDE: 95 mmol/L — AB (ref 98–111)
CO2: 20 mmol/L — ABNORMAL LOW (ref 22–32)
Calcium: 9.5 mg/dL (ref 8.9–10.3)
Creatinine, Ser: 0.88 mg/dL (ref 0.61–1.24)
GFR calc Af Amer: >60 mL/min (ref >60)
GFR calc non Af Amer: >60 mL/min (ref >60)
GLUCOSE: 116 mg/dL — AB (ref 70–99)
POTASSIUM: 3.1 mmol/L — AB (ref 3.5–5.1)
SODIUM: 132 mmol/L — AB (ref 135–145)
Total Bilirubin: 2.7 mg/dL — ABNORMAL HIGH (ref 0.3–1.2)
Total Protein: 9.8 g/dL — ABNORMAL HIGH (ref 6.5–8.1)

## 2018-10-01 LAB — CBC
HEMATOCRIT: 43.3 % (ref 39.0–52.0)
HEMOGLOBIN: 15.1 g/dL (ref 13.0–17.0)
MCH: 28.8 pg (ref 26.0–34.0)
MCHC: 34.9 g/dL (ref 30.0–36.0)
MCV: 82.5 fL (ref 80.0–100.0)
Platelets: 446 10*3/uL — ABNORMAL HIGH (ref 150–400)
RBC: 5.25 MIL/uL (ref 4.22–5.81)
RDW: 12.6 % (ref 11.5–15.5)
WBC: 15 10*3/uL — AB (ref 4.0–10.5)
nRBC: 0 % (ref 0.0–0.2)

## 2018-10-01 LAB — GROUP A STREP BY PCR: Group A Strep by PCR: DETECTED — AB

## 2018-10-01 LAB — LIPASE, BLOOD: LIPASE: 25 U/L (ref 11–51)

## 2018-10-01 MED ORDER — IBUPROFEN 600 MG PO TABS: 600.0000 mg | ORAL_TABLET | Freq: Four times a day (QID) | ORAL | 0 refills | Status: DC | PRN

## 2018-10-01 MED ORDER — DEXAMETHASONE SODIUM PHOSPHATE 10 MG/ML IJ SOLN
10.0000 mg | Freq: Once | INTRAMUSCULAR | Status: AC
  Administered 2018-10-01: 10 mg via INTRAVENOUS
  Filled 2018-10-01: qty 1

## 2018-10-01 MED ORDER — SODIUM CHLORIDE 0.9 % IV BOLUS
1000.0000 mL | Freq: Once | INTRAVENOUS | Status: AC
  Administered 2018-10-01: 1000 mL via INTRAVENOUS

## 2018-10-01 MED ORDER — KETOROLAC TROMETHAMINE 30 MG/ML IJ SOLN
15.0000 mg | Freq: Once | INTRAMUSCULAR | Status: AC
  Administered 2018-10-01: 15 mg via INTRAVENOUS
  Filled 2018-10-01: qty 1

## 2018-10-01 MED ORDER — LIDOCAINE VISCOUS HCL 2 % MT SOLN: 15.0000 mL | OROMUCOSAL | 0 refills | Status: DC | PRN

## 2018-10-01 MED ORDER — PENICILLIN G BENZATHINE 1200000 UNIT/2ML IM SUSP
1.2000 10*6.[IU] | Freq: Once | INTRAMUSCULAR | Status: AC
  Administered 2018-10-01: 1.2 10*6.[IU] via INTRAMUSCULAR
  Filled 2018-10-01 (×2): qty 2

## 2018-10-01 MED ORDER — AMOXICILLIN 500 MG PO CAPS: 500.0000 mg | ORAL_CAPSULE | Freq: Once | ORAL | Status: DC

## 2018-10-01 MED ORDER — ONDANSETRON HCL 4 MG/2ML IJ SOLN
4.0000 mg | Freq: Once | INTRAMUSCULAR | Status: AC
  Administered 2018-10-01: 4 mg via INTRAVENOUS
  Filled 2018-10-01: qty 2

## 2018-10-01 MED ORDER — LIDOCAINE VISCOUS HCL 2 % MT SOLN
15.0000 mL | Freq: Once | OROMUCOSAL | Status: AC
  Administered 2018-10-01: 15 mL via OROMUCOSAL
  Filled 2018-10-01: qty 15

## 2018-10-01 MED ORDER — ONDANSETRON 4 MG PO TBDP: 4.0000 mg | ORAL_TABLET | Freq: Three times a day (TID) | ORAL | 0 refills | Status: DC | PRN

## 2018-10-01 MED ORDER — ACETAMINOPHEN 500 MG PO TABS
1000.0000 mg | ORAL_TABLET | Freq: Once | ORAL | Status: DC
  Filled 2018-10-01: qty 2

## 2018-10-01 MED ORDER — SODIUM CHLORIDE 0.9% FLUSH
3.0000 mL | Freq: Once | INTRAVENOUS | Status: AC
  Administered 2018-10-01: 3 mL via INTRAVENOUS

## 2018-10-01 MED ORDER — PREDNISONE 20 MG PO TABS: 60.0000 mg | ORAL_TABLET | Freq: Every day | ORAL | 0 refills | Status: DC

## 2018-10-01 NOTE — ED Notes (Signed)
NAD noted at time of D/C. Pt denies questions or concerns. Pt taken to the lobby via wheelchair at this time by family member.

## 2018-10-01 NOTE — ED Provider Notes (Signed)
[facility] Emergency Department Provider Note  ____________________________________________  Time seen: Approximately 12:12 PM  I have reviewed the triage vital signs and the nursing notes.   HISTORY  Chief Complaint Sore Throat   HPI James Sanchez is a 28 y.o. male with no significant past medical history who presents for evaluation of 5 days of sore throat, fever, nausea and vomiting.  Patient has been unable to keep anything down for the last 3 days.  Has had several daily episodes of nonbloody nonbilious emesis.  No diarrhea.  Is complaining of diffuse body aches, severe burning sharp sore throat.  Has had fever at home.  Also complaining of cough, congestion, and mild shortness of breath with exertion.  Is also complaining of diffuse crampy abdominal pain.  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 Authorizing Provider  docusate sodium (COLACE) 100 MG capsule Take 1 capsule (100 mg total) by mouth every 12 (twelve) hours. 04/17/16   Gilda Crease, MD  ibuprofen (ADVIL,MOTRIN) 600 MG tablet Take 1 tablet (600 mg total) by mouth every 6 (six) hours as needed. 10/01/18   Don Perking, [place], MD  lidocaine (XYLOCAINE) 2 % solution Use as directed 15 mLs in the mouth or throat as needed for mouth pain. 10/01/18   Nita Sickle, MD  LORazepam (ATIVAN) 1 MG tablet Take 1 tablet (1 mg total) by mouth 2 (two) times daily as needed for anxiety. 04/26/16   Jennye Moccasin, MD  ondansetron (ZOFRAN ODT) 4 MG disintegrating tablet Take 1 tablet (4 mg total) by mouth every 8 (eight) hours as needed. 10/01/18    Nita Sickle, MD  ondansetron (ZOFRAN) 4 MG tablet Take 1 tablet (4 mg total) by mouth every 8 (eight) hours as needed for nausea or vomiting. 04/26/16   Jennye Moccasin, MD  potassium chloride SA (K-DUR,KLOR-CON) 20 MEQ tablet Take 2 tablets (40 mEq total) by mouth daily. 04/17/16   Gilda Crease, MD  predniSONE (DELTASONE) 20 MG tablet Take 3 tablets (60 mg total) by mouth daily for 4 days. 10/01/18 10/05/18  Nita Sickle, MD  promethazine (PHENERGAN) 25 MG tablet Take 1 tablet (25 mg total) by mouth every 6 (six) hours as needed for nausea or vomiting. 04/17/16   Gilda Crease, MD  ranitidine (ZANTAC) 150 MG tablet Take 1 tablet (150 mg total) by mouth 2 (two) times daily. 04/17/16   Gilda Crease, MD    Allergies Aspirin and Sulfa antibiotics  No family history on file.  Social History Social History   Tobacco Use  . Smoking status: Never Smoker  . Smokeless tobacco: Never Used  Substance Use Topics  . Alcohol use: No  . Drug use: No    Review of Systems  Constitutional: + fever, body aches Eyes: Negative for visual changes. ENT: + sore throat. Neck: No neck pain  Cardiovascular: Negative for chest pain. Respiratory: + shortness of breath, cough Gastrointestinal: + diffuse abdominal pain, nausea, and vomiting. No diarrhea. Genitourinary: Negative for dysuria. Musculoskeletal: Negative for back pain. Skin: Negative for rash. Neurological: Negative for headaches, weakness or numbness. Psych: No SI or HI  ____________________________________________   PHYSICAL EXAM:  VITAL SIGNS: ED Triage Vitals  Enc  Vitals Group     BP 10/01/18 1024 (!) 167/119     Pulse Rate 10/01/18 1024 83     Resp 10/01/18 1024 20     Temp 10/01/18 1024 99.3 F (37.4 C)     Temp Source 10/01/18 1024 Oral     SpO2 10/01/18 1024 98 %     Weight 10/01/18 1021 280 lb (127 kg)     Height 10/01/18 1021 5\' 8"  (1.727 m)     Head Circumference --      Peak Flow --       Pain Score 10/01/18 1021 10     Pain Loc --      Pain Edu? --      Excl. in GC? --     Constitutional: Alert and oriented, patient is moaning, not answering to questions, wife is the one answering all questions for him. HEENT:      Head: Normocephalic and atraumatic.         Eyes: Conjunctivae are normal. Sclera is non-icteric.       Mouth/Throat: Mucous membranes are moist.  Bilateral tonsils are swollen, erythematous, with exudate, no peritonsillar abscess      Neck: Supple with no signs of meningismus. Cardiovascular: Regular rate and rhythm. No murmurs, gallops, or rubs. 2+ symmetrical distal pulses are present in all extremities. No JVD. Respiratory: Normal respiratory effort. Lungs are clear to auscultation bilaterally. No wheezes, crackles, or rhonchi.  Gastrointestinal: Soft, diffuse tenderness to palpation, and non distended with positive bowel sounds. No rebound or guarding. Musculoskeletal: Nontender with normal range of motion in all extremities. No edema, cyanosis, or erythema of extremities. Neurologic: Normal speech and language. Face is symmetric. Moving all extremities. No gross focal neurologic deficits are appreciated. Skin: Skin is warm, dry and intact. No rash noted. Psychiatric: Mood and affect are normal. Speech and behavior are normal.  ____________________________________________   LABS (all labs ordered are listed, but only abnormal results are displayed)  Labs Reviewed  GROUP A STREP BY PCR - Abnormal; Notable for the following components:      Result Value   Group A Strep by PCR DETECTED (*)    All other components within normal limits  COMPREHENSIVE METABOLIC PANEL - Abnormal; Notable for the following components:   Sodium 132 (*)    Potassium 3.1 (*)    Chloride 95 (*)    CO2 20 (*)    Glucose, Bld 116 (*)    Total Protein 9.8 (*)    AST 106 (*)    ALT 69 (*)    Total Bilirubin 2.7 (*)    Anion gap 17 (*)    All other components within  normal limits  CBC - Abnormal; Notable for the following components:   WBC 15.0 (*)    Platelets 446 (*)    All other components within normal limits  LIPASE, BLOOD  URINALYSIS, COMPLETE (UACMP) WITH MICROSCOPIC   ____________________________________________  EKG  none  ____________________________________________  RADIOLOGY  I have personally reviewed the images performed during this visit and I agree with the Radiologist's read.   Interpretation by Radiologist:  Dg Chest Portable 1 View  Result Date: 10/01/2018 CLINICAL DATA:  28 year old male with a history cough fever EXAM: PORTABLE CHEST 1 VIEW COMPARISON:  04/26/2016 FINDINGS: The heart size and mediastinal contours are within normal limits. Both lungs are clear. The visualized skeletal structures are unremarkable. IMPRESSION: Negative for acute cardiopulmonary disease Electronically Signed   By: Gilmer Mor D.O.  On: 10/01/2018 12:25      ____________________________________________   PROCEDURES  Procedure(s) performed: None Procedures Critical Care performed:  None ____________________________________________   INITIAL IMPRESSION / ASSESSMENT AND PLAN / ED COURSE   28 y.o. male with no significant past medical history who presents for evaluation of 5 days of sore throat, fever, body aches, cough, nausea and vomiting. Ddx strep versus flu versus viral illness versus pneumonia.  Patient with bilateral swollen erythematous tonsils with exudates, no peritonsillar abscess.  Abdomen is diffusely tender to palpation with no localized tenderness rebound or guarding, lungs are clear to auscultation.  Vitals show low-grade fever of 99.19F but otherwise within normal limits.  Labs showing leukocytosis with white count of 15, mild anion gap consistent with dehydration.  Mild hypokalemia, mildly elevated LFTs. Strep positive. CXR negative for PNA. Will treat with IM Penicillin, IVF, toradol, tylenol, zofran, decadron, viscous  lidocaine.     _________________________ 2:08 PM on 10/01/2018 ----------------------------------------- Patient feels markedly improved.  Remains normal vital signs.  Tolerating p.o. with no further episodes of vomiting.  Will discharge home on bland diet, Zofran, steroids, and follow-up with primary care doctor.  Discussed standard return precautions    As part of my medical decision making, I reviewed the following data within the electronic MEDICAL RECORD NUMBER Nursing notes reviewed and incorporated, Labs reviewed , Old chart reviewed, Radiograph reviewed , Notes from prior ED visits and [place] Controlled Substance Database    Pertinent labs & imaging results that were available during my care of the patient were reviewed by me and considered in my medical decision making (see chart for details).    ____________________________________________   FINAL CLINICAL IMPRESSION(S) / ED DIAGNOSES  Final diagnoses:  Strep pharyngitis      NEW MEDICATIONS STARTED DURING THIS VISIT:  ED Discharge Orders         Ordered    predniSONE (DELTASONE) 20 MG tablet  Daily     10/01/18 1407    ondansetron (ZOFRAN ODT) 4 MG disintegrating tablet  Every 8 hours PRN     10/01/18 1407    ibuprofen (ADVIL,MOTRIN) 600 MG tablet  Every 6 hours PRN     10/01/18 1407    lidocaine (XYLOCAINE) 2 % solution  As needed     10/01/18 1407           Note:  This document was prepared using Dragon voice recognition software and may include unintentional dictation errors.    Don PerkingVeronese, [place], MD 10/01/18 1409

## 2018-10-01 NOTE — ED Triage Notes (Addendum)
C/O sore throat, fever, N/V x 5 days.  STates was diagnosed with flu and strep throat.  States has been unable to keep anything down and has not eaten in 3 days.

## 2018-10-05 ENCOUNTER — Other Ambulatory Visit: Payer: Self-pay

## 2018-10-05 ENCOUNTER — Emergency Department: Payer: Self-pay

## 2018-10-05 ENCOUNTER — Observation Stay
Admission: EM | Admit: 2018-10-05 | Discharge: 2018-10-07 | Disposition: A | Discharge status: Home / Self Care | Payer: Self-pay | Attending: Specialist | Admitting: Specialist

## 2018-10-05 ENCOUNTER — Encounter: Payer: Self-pay | Admitting: Emergency Medicine

## 2018-10-05 DIAGNOSIS — Z23 Encounter for immunization: Secondary | ICD-10-CM | POA: Insufficient documentation

## 2018-10-05 DIAGNOSIS — R112 Nausea with vomiting, unspecified: Principal | ICD-10-CM | POA: Diagnosis present

## 2018-10-05 DIAGNOSIS — J020 Streptococcal pharyngitis: Secondary | ICD-10-CM | POA: Insufficient documentation

## 2018-10-05 DIAGNOSIS — F17200 Nicotine dependence, unspecified, uncomplicated: Secondary | ICD-10-CM | POA: Insufficient documentation

## 2018-10-05 DIAGNOSIS — Z886 Allergy status to analgesic agent status: Secondary | ICD-10-CM | POA: Insufficient documentation

## 2018-10-05 DIAGNOSIS — R1084 Generalized abdominal pain: Secondary | ICD-10-CM | POA: Insufficient documentation

## 2018-10-05 DIAGNOSIS — E871 Hypo-osmolality and hyponatremia: Secondary | ICD-10-CM | POA: Insufficient documentation

## 2018-10-05 DIAGNOSIS — E876 Hypokalemia: Secondary | ICD-10-CM | POA: Insufficient documentation

## 2018-10-05 DIAGNOSIS — Z79899 Other long term (current) drug therapy: Secondary | ICD-10-CM | POA: Insufficient documentation

## 2018-10-05 DIAGNOSIS — Z882 Allergy status to sulfonamides status: Secondary | ICD-10-CM | POA: Insufficient documentation

## 2018-10-05 DIAGNOSIS — Z791 Long term (current) use of non-steroidal anti-inflammatories (NSAID): Secondary | ICD-10-CM | POA: Insufficient documentation

## 2018-10-05 LAB — COMPREHENSIVE METABOLIC PANEL
ALT: 71 U/L — ABNORMAL HIGH (ref 0–44)
AST: 45 U/L — AB (ref 15–41)
Albumin: 3.8 g/dL (ref 3.5–5.0)
Alkaline Phosphatase: 77 U/L (ref 38–126)
Anion gap: 15 (ref 5–15)
BUN: 22 mg/dL — ABNORMAL HIGH (ref 6–20)
CO2: 22 mmol/L (ref 22–32)
Calcium: 9.3 mg/dL (ref 8.9–10.3)
Chloride: 92 mmol/L — ABNORMAL LOW (ref 98–111)
Creatinine, Ser: 0.84 mg/dL (ref 0.61–1.24)
GFR calc Af Amer: >60 mL/min (ref >60)
GFR calc non Af Amer: >60 mL/min (ref >60)
Glucose, Bld: 108 mg/dL — ABNORMAL HIGH (ref 70–99)
POTASSIUM: 3 mmol/L — AB (ref 3.5–5.1)
SODIUM: 129 mmol/L — AB (ref 135–145)
Total Bilirubin: 1.3 mg/dL — ABNORMAL HIGH (ref 0.3–1.2)
Total Protein: 9 g/dL — ABNORMAL HIGH (ref 6.5–8.1)

## 2018-10-05 LAB — CBC
HEMATOCRIT: 46.3 % (ref 39.0–52.0)
HEMOGLOBIN: 15.9 g/dL (ref 13.0–17.0)
MCH: 28.7 pg (ref 26.0–34.0)
MCHC: 34.3 g/dL (ref 30.0–36.0)
MCV: 83.6 fL (ref 80.0–100.0)
Platelets: 617 10*3/uL — ABNORMAL HIGH (ref 150–400)
RBC: 5.54 MIL/uL (ref 4.22–5.81)
RDW: 12.4 % (ref 11.5–15.5)
WBC: 11.6 10*3/uL — ABNORMAL HIGH (ref 4.0–10.5)
nRBC: 0 % (ref 0.0–0.2)

## 2018-10-05 LAB — INFLUENZA PANEL BY PCR (TYPE A & B)
Influenza A By PCR: NEGATIVE
Influenza B By PCR: NEGATIVE

## 2018-10-05 LAB — URINALYSIS, COMPLETE (UACMP) WITH MICROSCOPIC
BACTERIA UA: NONE SEEN
Bilirubin Urine: NEGATIVE
Glucose, UA: NEGATIVE mg/dL
Ketones, ur: 80 mg/dL — AB
Leukocytes,Ua: NEGATIVE
Nitrite: NEGATIVE
PROTEIN: 30 mg/dL — AB
Specific Gravity, Urine: 1.028 (ref 1.005–1.030)
pH: 6 (ref 5.0–8.0)

## 2018-10-05 LAB — MAGNESIUM: Magnesium: 2.1 mg/dL (ref 1.7–2.4)

## 2018-10-05 LAB — LIPASE, BLOOD: Lipase: 37 U/L (ref 11–51)

## 2018-10-05 LAB — TROPONIN I: Troponin I: <0.03 ng/mL (ref <0.03)

## 2018-10-05 MED ORDER — ONDANSETRON HCL 4 MG/2ML IJ SOLN: 4.0000 mg | Freq: Four times a day (QID) | INTRAMUSCULAR | Status: DC | PRN

## 2018-10-05 MED ORDER — LIDOCAINE VISCOUS HCL 2 % MT SOLN
15.0000 mL | OROMUCOSAL | Status: DC | PRN
  Administered 2018-10-06: 15 mL via OROMUCOSAL
  Filled 2018-10-05 (×2): qty 15

## 2018-10-05 MED ORDER — ONDANSETRON HCL 4 MG/2ML IJ SOLN
4.0000 mg | Freq: Once | INTRAMUSCULAR | Status: AC
  Administered 2018-10-05: 4 mg via INTRAVENOUS
  Filled 2018-10-05: qty 2

## 2018-10-05 MED ORDER — MORPHINE SULFATE (PF) 4 MG/ML IV SOLN
4.0000 mg | Freq: Once | INTRAVENOUS | Status: AC
  Administered 2018-10-05: 4 mg via INTRAVENOUS
  Filled 2018-10-05: qty 1

## 2018-10-05 MED ORDER — SODIUM CHLORIDE 0.9 % IV BOLUS
500.0000 mL | Freq: Once | INTRAVENOUS | Status: AC
  Administered 2018-10-05: 500 mL via INTRAVENOUS

## 2018-10-05 MED ORDER — IOHEXOL 300 MG/ML  SOLN
125.0000 mL | Freq: Once | INTRAMUSCULAR | Status: AC | PRN
  Administered 2018-10-05: 125 mL via INTRAVENOUS
  Filled 2018-10-05: qty 125

## 2018-10-05 MED ORDER — ENOXAPARIN SODIUM 40 MG/0.4ML ~~LOC~~ SOLN
40.0000 mg | Freq: Two times a day (BID) | SUBCUTANEOUS | Status: DC
  Administered 2018-10-05 – 2018-10-06 (×3): 40 mg via SUBCUTANEOUS
  Filled 2018-10-05 (×3): qty 0.4

## 2018-10-05 MED ORDER — POTASSIUM CHLORIDE IN NACL 20-0.9 MEQ/L-% IV SOLN
INTRAVENOUS | Status: DC
  Administered 2018-10-05 – 2018-10-06 (×3): via INTRAVENOUS
  Filled 2018-10-05 (×6): qty 1000

## 2018-10-05 MED ORDER — ACETAMINOPHEN 650 MG RE SUPP: 650.0000 mg | Freq: Four times a day (QID) | RECTAL | Status: DC | PRN

## 2018-10-05 MED ORDER — POTASSIUM CHLORIDE 20 MEQ PO PACK: 40.0000 meq | PACK | Freq: Once | ORAL | Status: DC

## 2018-10-05 MED ORDER — ONDANSETRON HCL 4 MG PO TABS: 4.0000 mg | ORAL_TABLET | Freq: Four times a day (QID) | ORAL | Status: DC | PRN

## 2018-10-05 MED ORDER — FAMOTIDINE IN NACL 20-0.9 MG/50ML-% IV SOLN
20.0000 mg | Freq: Two times a day (BID) | INTRAVENOUS | Status: DC
  Administered 2018-10-05 – 2018-10-06 (×2): 20 mg via INTRAVENOUS
  Filled 2018-10-05 (×2): qty 50

## 2018-10-05 MED ORDER — ACETAMINOPHEN 325 MG PO TABS: 650.0000 mg | ORAL_TABLET | Freq: Four times a day (QID) | ORAL | Status: DC | PRN

## 2018-10-05 MED ORDER — ALUM & MAG HYDROXIDE-SIMETH 200-200-20 MG/5ML PO SUSP
30.0000 mL | Freq: Once | ORAL | Status: AC
  Administered 2018-10-05: 30 mL via ORAL

## 2018-10-05 MED ORDER — FAMOTIDINE 20 MG PO TABS
20.0000 mg | ORAL_TABLET | Freq: Once | ORAL | Status: AC
  Administered 2018-10-05: 20 mg via ORAL

## 2018-10-05 MED ORDER — POTASSIUM CHLORIDE 10 MEQ/100ML IV SOLN
10.0000 meq | Freq: Once | INTRAVENOUS | Status: AC
  Administered 2018-10-06: 10 meq via INTRAVENOUS
  Filled 2018-10-05 (×2): qty 100

## 2018-10-05 MED ORDER — SODIUM CHLORIDE 0.9% FLUSH
3.0000 mL | Freq: Once | INTRAVENOUS | Status: AC
  Administered 2018-10-05: 3 mL via INTRAVENOUS

## 2018-10-05 MED ORDER — MORPHINE SULFATE (PF) 2 MG/ML IV SOLN
2.0000 mg | INTRAVENOUS | Status: DC | PRN
  Administered 2018-10-06 (×3): 2 mg via INTRAVENOUS
  Filled 2018-10-05 (×3): qty 1

## 2018-10-05 MED ORDER — INFLUENZA VAC SPLIT QUAD 0.5 ML IM SUSY
0.5000 mL | PREFILLED_SYRINGE | INTRAMUSCULAR | Status: AC
  Administered 2018-10-06: 0.5 mL via INTRAMUSCULAR
  Filled 2018-10-05: qty 0.5

## 2018-10-05 MED ORDER — ONDANSETRON HCL 4 MG/2ML IJ SOLN
4.0000 mg | Freq: Once | INTRAMUSCULAR | Status: AC | PRN
  Administered 2018-10-05: 4 mg via INTRAVENOUS
  Filled 2018-10-05: qty 2

## 2018-10-05 MED ORDER — POTASSIUM CHLORIDE 10 MEQ/100ML IV SOLN
10.0000 meq | Freq: Once | INTRAVENOUS | Status: AC
  Administered 2018-10-05: 10 meq via INTRAVENOUS
  Filled 2018-10-05: qty 100

## 2018-10-05 MED ORDER — POTASSIUM CHLORIDE CRYS ER 20 MEQ PO TBCR
40.0000 meq | EXTENDED_RELEASE_TABLET | Freq: Once | ORAL | Status: DC
  Filled 2018-10-05: qty 2

## 2018-10-05 MED ORDER — MORPHINE SULFATE (PF) 4 MG/ML IV SOLN
6.0000 mg | Freq: Once | INTRAVENOUS | Status: AC
  Administered 2018-10-05: 6 mg via INTRAVENOUS
  Filled 2018-10-05: qty 2

## 2018-10-05 MED ORDER — PROMETHAZINE HCL 25 MG/ML IJ SOLN
12.5000 mg | Freq: Once | INTRAMUSCULAR | Status: AC
  Administered 2018-10-05: 12.5 mg via INTRAMUSCULAR
  Filled 2018-10-05: qty 1

## 2018-10-05 MED ORDER — ALUM & MAG HYDROXIDE-SIMETH 200-200-20 MG/5ML PO SUSP: 30.0000 mL | Freq: Four times a day (QID) | ORAL | Status: DC | PRN

## 2018-10-05 MED ORDER — SODIUM CHLORIDE 0.9 % IV BOLUS
1000.0000 mL | Freq: Once | INTRAVENOUS | Status: AC
  Administered 2018-10-05: 1000 mL via INTRAVENOUS

## 2018-10-05 NOTE — ED Notes (Signed)
Pt vomiting after drinking a small amount of ginger ale. Pt unable to tolerate oral potassium order at this time,.

## 2018-10-05 NOTE — ED Triage Notes (Signed)
Pt presents to ED with c/o generalized chest pain, abdominal pain, N/V. Pt appears uncomfortable in triage. Pt states chest pain has been ongoing since he was seen on 2/21. Pt states pain 10/10. Pt states "I can't describe the pain, first it feels like I can't breathe, I can't control my breathing".

## 2018-10-05 NOTE — ED Notes (Signed)
Patient transported to X-ray 

## 2018-10-05 NOTE — Progress Notes (Signed)
Anticoagulation monitoring(Lovenox):  28 yo male ordered Lovenox 40 mg Q24h  Filed Weights   10/05/18 1116  Weight: 280 lb (127 kg)   BMI 42.57   Lab Results  Component Value Date   CREATININE 0.84 10/05/2018   CREATININE 0.88 10/01/2018   CREATININE 1.08 04/26/2016   Estimated Creatinine Clearance: 170 mL/min (by C-G formula based on SCr of 0.84 mg/dL). Hemoglobin & Hematocrit     Component Value Date/Time   HGB 15.9 10/05/2018 1124   HGB 15.2 12/11/2013 1428   HCT 46.3 10/05/2018 1124   HCT 44.8 12/11/2013 1428     Per Protocol for Patient with estCrcl > 30 ml/min and BMI > 40, will transition to Lovenox 40 mg Q12h.

## 2018-10-05 NOTE — ED Notes (Signed)
ED TO INPATIENT HANDOFF REPORT  ED Nurse Name and Phone #:  Atianna Haidar, [phone]  S Name/Age/Gender James Sanchez 28 y.o. male Room/Bed: ED37A/ED37A  Code Status   Code Status: Not on file  Home/SNF/Other Home Patient oriented to: self, place, time and situation Is this baseline? Yes   Triage Complete: Triage complete  Chief Complaint vomiting  Triage Note Pt presents to ED with c/o generalized chest pain, abdominal pain, N/V. Pt appears uncomfortable in triage. Pt states chest pain has been ongoing since he was seen on 2/21. Pt states pain 10/10. Pt states "I can't describe the pain, first it feels like I can't breathe, I can't control my breathing".    Allergies Allergies  Allergen Reactions  . Aspirin Nausea And Vomiting    As child per mother  . Sulfa Antibiotics Nausea And Vomiting    As child per mother    Level of Care/Admitting Diagnosis ED Disposition    ED Disposition Condition Comment   Admit  Hospital Area: [facility] [[id number]]  Level of Care: Med-Surg [16]  Diagnosis: Intractable nausea and vomiting [720114]  Admitting Physician: Houston SirenSAINANI, VIVEK J [[id number]]  Attending Physician: Houston SirenSAINANI, VIVEK J [[id number]]  PT Class (Do Not Modify): Observation [104]  PT Acc Code (Do Not Modify): Observation [10022]       B Medical/Surgery History History reviewed. No pertinent past medical history. History reviewed. No pertinent surgical history.   A IV Location/Drains/Wounds Patient Lines/Drains/Airways Status   Active Line/Drains/Airways    Name:   Placement date:   Placement time:   Site:   Days:   Peripheral IV 10/05/18 Left Forearm   10/05/18    1348    Forearm   less than 1          Intake/Output Last 24 hours  Intake/Output Summary (Last 24 hours) at 10/05/2018 1948 Last data filed at 10/05/2018 1947 Gross per 24 hour  Intake 1605.23 ml  Output -  Net 1605.23 ml    Labs/Imaging Results for orders placed or performed during the  hospital encounter of 10/05/18 (from the past 48 hour(s))  Lipase, blood     Status: None   Collection Time: 10/05/18 11:24 AM  Result Value Ref Range   Lipase 37 11 - 51 U/L    Comment: Performed at [facility], [address] [place], [place] [address]  Comprehensive metabolic panel     Status: Abnormal   Collection Time: 10/05/18 11:24 AM  Result Value Ref Range   Sodium 129 (L) 135 - 145 mmol/L   Potassium 3.0 (L) 3.5 - 5.1 mmol/L   Chloride 92 (L) 98 - 111 mmol/L   CO2 22 22 - 32 mmol/L   Glucose, Bld 108 (H) 70 - 99 mg/dL   BUN 22 (H) 6 - 20 mg/dL   Creatinine, Ser 4.780.84 0.61 - 1.24 mg/dL   Calcium 9.3 8.9 - 29.510.3 mg/dL   Total Protein 9.0 (H) 6.5 - 8.1 g/dL   Albumin 3.8 3.5 - 5.0 g/dL   AST 45 (H) 15 - 41 U/L   ALT 71 (H) 0 - 44 U/L   Alkaline Phosphatase 77 38 - 126 U/L   Total Bilirubin 1.3 (H) 0.3 - 1.2 mg/dL   GFR calc non Af Amer >60 >60 mL/min   GFR calc Af Amer >60 >60 mL/min   Anion gap 15 5 - 15    Comment: Performed at [facility], [address] [place], [place] [address]  CBC     Status: Abnormal   Collection Time: 10/05/18 11:24 AM  Result Value Ref Range   WBC 11.6 (H) 4.0 - 10.5 K/uL   RBC 5.54 4.22 - 5.81 MIL/uL   Hemoglobin 15.9 13.0 - 17.0 g/dL   HCT 40.9 81.1 - 91.4 %   MCV 83.6 80.0 - 100.0 fL   MCH 28.7 26.0 - 34.0 pg   MCHC 34.3 30.0 - 36.0 g/dL   RDW 78.2 95.6 - 21.3 %   Platelets 617 (H) 150 - 400 K/uL   nRBC 0.0 0.0 - 0.2 %    Comment: Performed at [facility], [address] [place], [place] [address]  Troponin I - ONCE - STAT     Status: None   Collection Time: 10/05/18 11:24 AM  Result Value Ref Range   Troponin I <0.03 <0.03 ng/mL    Comment: Performed at [facility], [address] [place], [place] [address]  Magnesium     Status: None   Collection Time: 10/05/18 11:24 AM  Result Value Ref Range   Magnesium 2.1 1.7 - 2.4 mg/dL    Comment: Performed at [facility], [address], [place], [place] [address]  Influenza panel by PCR (type A & B)     Status: None   Collection Time: 10/05/18  2:51 PM  Result Value Ref Range   Influenza A By PCR NEGATIVE NEGATIVE   Influenza B By PCR NEGATIVE NEGATIVE    Comment: (NOTE) The Xpert Xpress Flu assay is intended as an aid in the diagnosis of  influenza and should not be used as a sole basis for treatment.  This  assay is FDA approved for nasopharyngeal swab specimens only. Nasal  washings and aspirates are unacceptable for Xpert Xpress Flu testing. Performed at [facility], [address] [place], [place] [address]   Urinalysis, Complete w Microscopic     Status: Abnormal   Collection Time: 10/05/18  5:21 PM  Result Value Ref Range   Color, Urine YELLOW (A) YELLOW   APPearance CLEAR (A) CLEAR   Specific Gravity, Urine 1.028 1.005 - 1.030   pH 6.0 5.0 - 8.0   Glucose, UA NEGATIVE NEGATIVE mg/dL   Hgb urine dipstick SMALL (A) NEGATIVE   Bilirubin Urine NEGATIVE NEGATIVE   Ketones, ur 80 (A) NEGATIVE mg/dL   Protein, ur 30 (A) NEGATIVE mg/dL   Nitrite NEGATIVE NEGATIVE   Leukocytes,Ua NEGATIVE NEGATIVE   RBC / HPF 0-5 0 - 5 RBC/hpf   WBC, UA 0-5 0 - 5 WBC/hpf   Bacteria, UA NONE SEEN NONE SEEN   Squamous Epithelial / LPF 0-5 0 - 5   Mucus PRESENT     Comment: Performed at [facility], [address], [place], [place] [address]   Dg Chest 2 View  Result Date: 10/05/2018 CLINICAL DATA:  Chest pain with nausea and vomiting EXAM: CHEST - 2 VIEW COMPARISON:  October 01, 2018 FINDINGS: Lungs are clear. Heart size and pulmonary vascularity are normal. No adenopathy. No pneumothorax. No bone lesions. IMPRESSION: No edema or consolidation. Electronically Signed   By: Bretta Bang III M.D.   On: 10/05/2018 14:53   Ct Abdomen Pelvis W Contrast  Result Date: 10/05/2018 CLINICAL DATA:  Severe abdominal pain. The patient reports she has not had a bowel movement for 9 days. EXAM: CT  ABDOMEN AND PELVIS WITH CONTRAST TECHNIQUE: Multidetector CT imaging of the abdomen and pelvis was performed using the standard  protocol following bolus administration of intravenous contrast. CONTRAST:  125 mL OMNIPAQUE IOHEXOL 300 MG/ML  SOLN COMPARISON:  None. FINDINGS: Lower chest: Lung bases clear.  No pleural or pericardial effusion. Hepatobiliary: No focal liver abnormality is seen. No gallstones, gallbladder wall thickening, or biliary dilatation. Pancreas: Unremarkable. No pancreatic ductal dilatation or surrounding inflammatory changes. Spleen: Normal in size without focal abnormality. Adrenals/Urinary Tract: Adrenal glands are unremarkable. Kidneys are normal, without renal calculi, focal lesion, or hydronephrosis. Bladder is unremarkable. Stomach/Bowel: Stomach is within normal limits. Appendix appears normal. No evidence of bowel wall thickening, distention, or inflammatory changes. Stool burden is normal. Vascular/Lymphatic: No significant vascular findings are present. No enlarged abdominal or pelvic lymph nodes. Reproductive: Prostate is unremarkable. Other: None. Musculoskeletal: No acute abnormality. 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 IMPRESSION: No acute abnormality or finding to explain the patient's symptoms. Stool burden appears normal. 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 Electronically Signed   By: Drusilla Kanner M.D.   On: 10/05/2018 17:24    Pending Labs [employer] (From admission, onward)    Start     Ordered   Signed and Held  HIV antibody (Routine Testing)  Once,   R     Signed and Held   Signed and Held  Basic metabolic panel  Tomorrow morning,   R     Signed and Held   Signed and Held  CBC  (enoxaparin (LOVENOX)    CrCl >/= 30 ml/min)  Once,   R    Comments:  Baseline for enoxaparin therapy IF NOT ALREADY DRAWN.  Notify MD if PLT < 100 K.    Signed and Held   Signed  and Held  Creatinine, serum  (enoxaparin (LOVENOX)    CrCl >/= 30 ml/min)  Once,   R    Comments:  Baseline for enoxaparin therapy IF NOT ALREADY DRAWN.    Signed and Held   Signed and Held  Creatinine, serum  (enoxaparin (LOVENOX)    CrCl >/= 30 ml/min)  Weekly,   R    Comments:  while on enoxaparin therapy    Signed and Held          Vitals/Pain Today's Vitals   10/05/18 1114 10/05/18 1115 10/05/18 1116 10/05/18 1736  BP: (!) 132/97   (!) 142/98  Pulse: (!) 122   84  Resp: (!) 26   18  Temp: 98.5 F (36.9 C)   98 F (36.7 C)  TempSrc: Oral   Oral  SpO2: 98%   98%  Weight:   127 kg   Height:   5\' 8"  (1.727 m)   PainSc:  10-Worst pain ever      Isolation Precautions No active isolations  Medications Medications  potassium chloride (KLOR-CON) packet 40 mEq (40 mEq Oral Not Given 10/05/18 1809)  potassium chloride 10 mEq in 100 mL IVPB (has no administration in time range)  sodium chloride flush (NS) 0.9 % injection 3 mL (3 mLs Intravenous Given 10/05/18 1449)  ondansetron (ZOFRAN) injection 4 mg (4 mg Intravenous Given 10/05/18 1125)  alum & mag hydroxide-simeth (MAALOX/MYLANTA) 200-200-20 MG/5ML suspension 30 mL (30 mLs Oral Given 10/05/18 1132)  famotidine (PEPCID) tablet 20 mg (20 mg Oral Given 10/05/18 1132)  sodium chloride 0.9 % bolus 1,000 mL (0 mLs Intravenous Stopped 10/05/18 1552)  morphine 4 MG/ML injection 6 mg (6 mg Intravenous Given 10/05/18 1432)  ondansetron (ZOFRAN) injection 4 mg (4 mg Intravenous  Given 10/05/18 1432)  potassium chloride 10 mEq in 100 mL IVPB (0 mEq Intravenous Stopped 10/05/18 1549)  iohexol (OMNIPAQUE) 300 MG/ML solution 125 mL (125 mLs Intravenous Contrast Given 10/05/18 1650)  morphine 4 MG/ML injection 4 mg (4 mg Intravenous Given 10/05/18 1733)  promethazine (PHENERGAN) injection 12.5 mg (12.5 mg Intramuscular Given 10/05/18 1733)  ondansetron (ZOFRAN) injection 4 mg (4 mg Intravenous Given 10/05/18 1849)  sodium chloride 0.9 % bolus 500 mL (  Intravenous Stopped 10/05/18 1934)    Mobility walks Low fall risk   Focused Assessments    R Recommendations: See Admitting Provider Note  Report given to:   Additional Notes:

## 2018-10-05 NOTE — ED Notes (Signed)
Pt's care discussed with Dr. Quale, see orders.  

## 2018-10-05 NOTE — ED Provider Notes (Signed)
[facility] Emergency Department Provider Note   ____________________________________________   First MD Initiated Contact with Patient 10/05/18 1413     (approximate)  I have reviewed the triage vital signs and the nursing notes.   HISTORY  Chief Complaint Abdominal Pain and Chest Pain    HPI James Sanchez is a 28 y.o. male reports he is his history is that of being very healthy except he was diagnosed with strep and is given antibiotic for that 4 days ago.  About a day after going to the ER he reports he started having increasing pain in his abdomen, has not a bowel movement for "9 days" he has been vomiting pretty much everything he tries to eat and has not had a bowel movement or any diarrhea for 9 days.  He had fevers initially but those have improved.  He has been fatigued, vomiting up multiple times stomach acid.  No black or bloody emesis.  Reports severe abdominal pain, crampy in nature mostly in the upper abdomen.  No chest pain, has had a little bit of coughing off and on.  Continues to feel like he is getting more sick.  Cannot tolerate anything by mouth.  No headache or neck pain.  No rash.  No pain or discomfort in his lower extremities.   History reviewed. No pertinent past medical history.  There are no active problems to display for this patient.   History reviewed. No pertinent surgical history.  Prior to Admission medications   Medication Sig Start Date End Date Taking? Authorizing Provider  docusate sodium (COLACE) 100 MG capsule Take 1 capsule (100 mg total) by mouth every 12 (twelve) hours. 04/17/16   Gilda Crease, MD  ibuprofen (ADVIL,MOTRIN) 600 MG tablet Take 1 tablet (600 mg total) by mouth every 6 (six) hours as needed. 10/01/18   Don Perking, [place], MD  lidocaine (XYLOCAINE) 2 % solution Use as directed 15 mLs in the mouth or throat as needed for mouth pain. 10/01/18   Nita Sickle, MD  LORazepam (ATIVAN) 1 MG  tablet Take 1 tablet (1 mg total) by mouth 2 (two) times daily as needed for anxiety. 04/26/16   Jennye Moccasin, MD  ondansetron (ZOFRAN ODT) 4 MG disintegrating tablet Take 1 tablet (4 mg total) by mouth every 8 (eight) hours as needed. 10/01/18   Nita Sickle, MD  ondansetron (ZOFRAN) 4 MG tablet Take 1 tablet (4 mg total) by mouth every 8 (eight) hours as needed for nausea or vomiting. 04/26/16   Jennye Moccasin, MD  potassium chloride SA (K-DUR,KLOR-CON) 20 MEQ tablet Take 2 tablets (40 mEq total) by mouth daily. 04/17/16   Gilda Crease, MD  predniSONE (DELTASONE) 20 MG tablet Take 3 tablets (60 mg total) by mouth daily for 4 days. 10/01/18 10/05/18  Nita Sickle, MD  promethazine (PHENERGAN) 25 MG tablet Take 1 tablet (25 mg total) by mouth every 6 (six) hours as needed for nausea or vomiting. 04/17/16   Gilda Crease, MD  ranitidine (ZANTAC) 150 MG tablet Take 1 tablet (150 mg total) by mouth 2 (two) times daily. 04/17/16   Gilda Crease, MD    Allergies Aspirin and Sulfa antibiotics  No family history on file.  Social History Social History   Tobacco Use  . Smoking status: Never Smoker  . Smokeless tobacco: Never Used  Substance Use Topics  . Alcohol use: No  . Drug use: No    Review of Systems Constitutional: Fevers and chills  mostly around the time he diagnosed a few days ago, fever seem to be improving Eyes: No visual changes. ENT: Sore throat but this is improving slowly.  No trouble breathing.  No trouble swallowing except it is slightly tender. Cardiovascular: Denies chest pain. Respiratory: Denies shortness of breath.  An occasional cough is present. Gastrointestinal: See HPI Genitourinary: Negative for dysuria. Musculoskeletal: Negative for back pain. Skin: Negative for rash. Neurological: Negative for headaches, areas of focal weakness or numbness.    ____________________________________________   PHYSICAL EXAM:  VITAL  SIGNS: ED Triage Vitals  Enc Vitals Group     BP 10/05/18 1114 (!) 132/97     Pulse Rate 10/05/18 1114 (!) 122     Resp 10/05/18 1114 (!) 26     Temp 10/05/18 1114 98.5 F (36.9 C)     Temp Source 10/05/18 1114 Oral     SpO2 10/05/18 1114 98 %     Weight 10/05/18 1116 280 lb (127 kg)     Height 10/05/18 1116  (1.727 m)     Head Circumference --      Peak Flow --      Pain Score 10/05/18 1115 10     Pain Loc --      Pain Edu? --      Excl. in GC? --     Constitutional: Alert and oriented.  He appears generally ill.  He is writhing about in the bed unable to get comfortable reports significant abdominal pain mostly in the upper abdomen.  Feels nauseated.  Reports he is vomited large amount throughout the day today. Eyes: Conjunctivae are normal. Head: Atraumatic. Nose: No congestion/rhinnorhea. Mouth/Throat: Mucous membranes are dry, tonsils are equal bilaterally, they are not erythematous but without purulence.  No trouble swallowing and there is no mass-effect or uvular deviation Neck: No stridor.  Cardiovascular: Tachycardic rate, regular rhythm. Grossly normal heart sounds.  Good peripheral circulation. Respiratory: Normal respiratory effort slight slightly tachypneic.  No retractions. Lungs CTAB. Gastrointestinal: Soft in general, fairly tender throughout perhaps increased tenderness over the left side of the abdomen.  No obvious rebound but he does demonstrate some slight voluntary guarding. Musculoskeletal: No lower extremity tenderness nor edema. Neurologic:  Normal speech and language. No gross focal neurologic deficits are appreciated.  Skin:  Skin is warm, dry and intact. No rash noted. Psychiatric: Mood and affect are normal. Speech and behavior are normal.  ____________________________________________   LABS (all labs ordered are listed, but only abnormal results are displayed)  Labs Reviewed  COMPREHENSIVE METABOLIC PANEL - Abnormal; Notable for the following  components:      Result Value   Sodium 129 (*)    Potassium 3.0 (*)    Chloride 92 (*)    Glucose, Bld 108 (*)    BUN 22 (*)    Total Protein 9.0 (*)    AST 45 (*)    ALT 71 (*)    Total Bilirubin 1.3 (*)    All other components within normal limits  CBC - Abnormal; Notable for the following components:   WBC 11.6 (*)    Platelets 617 (*)    All other components within normal limits  URINALYSIS, COMPLETE (UACMP) WITH MICROSCOPIC - Abnormal; Notable for the following components:   Color, Urine YELLOW (*)    APPearance CLEAR (*)    Hgb urine dipstick SMALL (*)    Ketones, ur 80 (*)    Protein, ur 30 (*)    All other components within  normal limits  LIPASE, BLOOD  TROPONIN I  MAGNESIUM  INFLUENZA PANEL BY PCR (TYPE A & B)   ____________________________________________  EKG  Reviewed enterotomy at 1115 Heart rate 120 QRS 89 QTC 470 Sinus tachycardia, no evidence acute ischemia. ____________________________________________  RADIOLOGY  Dg Chest 2 View  Result Date: 10/05/2018 CLINICAL DATA:  Chest pain with nausea and vomiting EXAM: CHEST - 2 VIEW COMPARISON:  October 01, 2018 FINDINGS: Lungs are clear. Heart size and pulmonary vascularity are normal. No adenopathy. No pneumothorax. No bone lesions. IMPRESSION: No edema or consolidation. Electronically Signed   By: Bretta Bang III M.D.   On: 10/05/2018 14:53   Ct Abdomen Pelvis W Contrast  Result Date: 10/05/2018 CLINICAL DATA:  Severe abdominal pain. The patient reports she has not had a bowel movement for 9 days. EXAM: CT ABDOMEN AND PELVIS WITH CONTRAST TECHNIQUE: Multidetector CT imaging of the abdomen and pelvis was performed using the standard protocol following bolus administration of intravenous contrast. CONTRAST:  125 mL OMNIPAQUE IOHEXOL 300 MG/ML  SOLN COMPARISON:  None. FINDINGS: Lower chest: Lung bases clear.  No pleural or pericardial effusion. Hepatobiliary: No focal liver abnormality is seen. No  gallstones, gallbladder wall thickening, or biliary dilatation. Pancreas: Unremarkable. No pancreatic ductal dilatation or surrounding inflammatory changes. Spleen: Normal in size without focal abnormality. Adrenals/Urinary Tract: Adrenal glands are unremarkable. Kidneys are normal, without renal calculi, focal lesion, or hydronephrosis. Bladder is unremarkable. Stomach/Bowel: Stomach is within normal limits. Appendix appears normal. No evidence of bowel wall thickening, distention, or inflammatory changes. Stool burden is normal. Vascular/Lymphatic: No significant vascular findings are present. No enlarged abdominal or pelvic lymph nodes. Reproductive: Prostate is unremarkable. Other: None. Musculoskeletal: No acute abnormality. 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 IMPRESSION: No acute abnormality or finding to explain the patient's symptoms. Stool burden appears normal. 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 Electronically Signed   By: Drusilla Kanner M.D.   On: 10/05/2018 17:24    CT reviewed.  Negative for acute findings. ____________________________________________   PROCEDURES  Procedure(s) performed: None  Procedures  Critical Care performed: No  ____________________________________________   INITIAL IMPRESSION / ASSESSMENT AND PLAN / ED COURSE  Pertinent labs & imaging results that were available during my care of the patient were reviewed by me and considered in my medical decision making (see chart for details).   Patient presents for ongoing nausea vomiting fatigue over about the last 3 days.  Recently treated for strep.    ----------------------------------------- 5:40 PM on 10/05/2018 -----------------------------------------  Patient CT scan reviewed negative for acute.  His work-up in the ER if very reassuring.  Some slight hyponatremia, hypokalemia which appears to be chronic.   His last couple of sodiums have also been slightly low.  Is been well hydrated here has not shown any evidence of active emesis.  Will trial oral regimen.  He appears to be improved, hydrated, potassium repleted.  Reassuring CT and chest x-ray.  His vital signs of improved heart rate is normalized, he appears much improved after treatment.  Suspect likely ongoing strep with associated nausea and vomiting at this point or possible viral illness.  ----------------------------------------- 6:21 PM on 10/05/2018 -----------------------------------------  Patient p.o. challenge after Zofran and Phenergan, given vomiting.  He is awake and alert, but continues to be unable to take by mouth.  Will admit for intractable nausea and vomiting.  Patient reports he would like to be admitted, he does not  think he could make it at home at this point.  His white count is downtrending he was treated appropriately with penicillin for strep.  He is afebrile at this time.  Improving with hydration, suspect he is likely pretty dehydrated, also in need of antiemetics and discussed with Dr. Cherlynn KaiserSainani.  Will admit to the hospitalist service. ____________________________________________   FINAL CLINICAL IMPRESSION(S) / ED DIAGNOSES  Final diagnoses:  Hypokalemia  Intractable nausea and vomiting  Strep pharyngitis        Note:  This document was prepared using Dragon voice recognition software and may include unintentional dictation errors       Sharyn CreamerQuale, Majour Frei, MD 10/05/18 1823

## 2018-10-05 NOTE — ED Notes (Signed)
Pt states he is feeling much better able to rest. Pt is in NAD will continue to monitor the pt,.

## 2018-10-05 NOTE — ED Notes (Signed)
Pt was seen here on 2/21 dx with strep given Pen G injection. States for the past 3 days having severe abd pain with constant N/V. States he has not had a BM in 9 days. Pt is rocking back and forth in bed. Pt is wearing an issued  ankle bracelet on arrival to the ED. Significant other is at the bedside,.

## 2018-10-05 NOTE — H&P (Signed)
[facility] - [place] at [facility]    PATIENT NAME: James Sanchez    MR#:  [id number]  DATE OF BIRTH:  11-12-1990  DATE OF ADMISSION:  10/05/2018  PRIMARY CARE PHYSICIAN: None   REQUESTING/REFERRING PHYSICIAN: Dr. Sharyn Creamer.    CHIEF COMPLAINT:   Chief Complaint  Patient presents with  . Abdominal Pain  . Chest Pain    HISTORY OF PRESENT ILLNESS:  James Sanchez  is a 28 y.o. male with no known past medical history who presents to the hospital due to intractable nausea and vomiting.  Patient was just recently seen in the ER a few days ago diagnosed with strep throat given IM penicillin and discharged home.  He has been having intractable nausea vomiting since he was diagnosed with strep throat and has not been able to eat or drink anything in almost 9 days as per the patient.  He presented to the ER and had multiple episodes of vomiting despite getting antiemetics, he was noted to be mildly hyponatremic and hypokalemic and therefore hospitalist services were contacted for admission.  Patient is also complaining of generalized abdominal pain and has not had a bowel movement in a few days, patient CT abdomen pelvis although was negative for any acute pathology.  PAST MEDICAL HISTORY:  History reviewed. No pertinent past medical history.  PAST SURGICAL HISTORY:  History reviewed. No pertinent surgical history.  SOCIAL HISTORY:   Social History   Tobacco Use  . Smoking status: Current Every Day Smoker  . Smokeless tobacco: Never Used  Substance Use Topics  . Alcohol use: No    FAMILY HISTORY:   Family History  Problem Relation Age of Onset  . Asthma Mother   . Diabetes Maternal Grandmother   . Diabetes Maternal Grandfather   . Diabetes Paternal Grandmother   . Diabetes Paternal Grandfather     DRUG ALLERGIES:   Allergies  Allergen Reactions  . Aspirin Nausea And Vomiting    As child per mother  . Sulfa Antibiotics Nausea And Vomiting    As child  per mother    REVIEW OF SYSTEMS:   Review of Systems  Constitutional: Negative for fever and weight loss.  HENT: Negative for congestion, nosebleeds and tinnitus.   Eyes: Negative for blurred vision, double vision and redness.  Respiratory: Negative for cough, hemoptysis and shortness of breath.   Cardiovascular: Negative for chest pain, orthopnea, leg swelling and PND.  Gastrointestinal: Positive for abdominal pain, nausea and vomiting. Negative for diarrhea and melena.  Genitourinary: Negative for dysuria, hematuria and urgency.  Musculoskeletal: Negative for falls and joint pain.  Neurological: Negative for dizziness, tingling, sensory change, focal weakness, seizures, weakness and headaches.  Endo/Heme/Allergies: Negative for polydipsia. Does not bruise/bleed easily.  Psychiatric/Behavioral: Negative for depression and memory loss. The patient is not nervous/anxious.     MEDICATIONS AT HOME:   Prior to Admission medications   Medication Sig Start Date End Date Taking? Authorizing Provider  ibuprofen (ADVIL,MOTRIN) 600 MG tablet Take 1 tablet (600 mg total) by mouth every 6 (six) hours as needed. 10/01/18  Yes Veronese, [place], MD  lidocaine (XYLOCAINE) 2 % solution Use as directed 15 mLs in the mouth or throat as needed for mouth pain. 10/01/18  Yes Don Perking, [place], MD  ondansetron (ZOFRAN ODT) 4 MG disintegrating tablet Take 1 tablet (4 mg total) by mouth every 8 (eight) hours as needed. 10/01/18  Yes Veronese, [place], MD  predniSONE (DELTASONE) 20 MG tablet Take 3 tablets (60 mg  total) by mouth daily for 4 days. 10/01/18 10/05/18 Yes Veronese, [place], MD      VITAL SIGNS:  Blood pressure (!) 142/98, pulse 84, temperature 98 F (36.7 C), temperature source Oral, resp. rate 18, height 5\' 8"  (1.727 m), weight 127 kg, SpO2 98 %.  PHYSICAL EXAMINATION:  Physical Exam  GENERAL:  28 y.o.-year-old patient lying in the bed in no acute distress.  EYES: Pupils equal, round,  reactive to light and accommodation. No scleral icterus. Extraocular muscles intact.  HEENT: Head atraumatic, normocephalic. Oropharynx and nasopharynx clear. No oropharyngeal erythema, moist oral mucosa  NECK:  Supple, no jugular venous distention. No thyroid enlargement, no tenderness.  LUNGS: Normal breath sounds bilaterally, no wheezing, rales, rhonchi. No use of accessory muscles of respiration.  CARDIOVASCULAR: S1, S2 RRR. No murmurs, rubs, gallops, clicks.  ABDOMEN: Soft, nontender, nondistended. Bowel sounds present. No organomegaly or mass.  EXTREMITIES: No pedal edema, cyanosis, or clubbing. + 2 pedal & radial pulses b/l.   NEUROLOGIC: Cranial nerves II through XII are intact. No focal Motor or sensory deficits appreciated b/l PSYCHIATRIC: The patient is alert and oriented x 3.  SKIN: No obvious rash, lesion, or ulcer.   LABORATORY PANEL:   CBC Recent Labs  Lab 10/05/18 1124  WBC 11.6*  HGB 15.9  HCT 46.3  PLT 617*   ------------------------------------------------------------------------------------------------------------------  Chemistries  Recent Labs  Lab 10/05/18 1124  NA 129*  K 3.0*  CL 92*  CO2 22  GLUCOSE 108*  BUN 22*  CREATININE 0.84  CALCIUM 9.3  MG 2.1  AST 45*  ALT 71*  ALKPHOS 77  BILITOT 1.3*   ------------------------------------------------------------------------------------------------------------------  Cardiac Enzymes Recent Labs  Lab 10/05/18 1124  TROPONINI <0.03   ------------------------------------------------------------------------------------------------------------------  RADIOLOGY:  Dg Chest 2 View  Result Date: 10/05/2018 CLINICAL DATA:  Chest pain with nausea and vomiting EXAM: CHEST - 2 VIEW COMPARISON:  October 01, 2018 FINDINGS: Lungs are clear. Heart size and pulmonary vascularity are normal. No adenopathy. No pneumothorax. No bone lesions. IMPRESSION: No edema or consolidation. Electronically Signed   By:  Bretta Bang III M.D.   On: 10/05/2018 14:53   Ct Abdomen Pelvis W Contrast  Result Date: 10/05/2018 CLINICAL DATA:  Severe abdominal pain. The patient reports she has not had a bowel movement for 9 days. EXAM: CT ABDOMEN AND PELVIS WITH CONTRAST TECHNIQUE: Multidetector CT imaging of the abdomen and pelvis was performed using the standard protocol following bolus administration of intravenous contrast. CONTRAST:  125 mL OMNIPAQUE IOHEXOL 300 MG/ML  SOLN COMPARISON:  None. FINDINGS: Lower chest: Lung bases clear.  No pleural or pericardial effusion. Hepatobiliary: No focal liver abnormality is seen. No gallstones, gallbladder wall thickening, or biliary dilatation. Pancreas: Unremarkable. No pancreatic ductal dilatation or surrounding inflammatory changes. Spleen: Normal in size without focal abnormality. Adrenals/Urinary Tract: Adrenal glands are unremarkable. Kidneys are normal, without renal calculi, focal lesion, or hydronephrosis. Bladder is unremarkable. Stomach/Bowel: Stomach is within normal limits. Appendix appears normal. No evidence of bowel wall thickening, distention, or inflammatory changes. Stool burden is normal. Vascular/Lymphatic: No significant vascular findings are present. No enlarged abdominal or pelvic lymph nodes. Reproductive: Prostate is unremarkable. Other: None. Musculoskeletal: No acute abnormality. 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 IMPRESSION: No acute abnormality or finding to explain the patient's symptoms. Stool burden appears normal. 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 Electronically Signed   By: Drusilla Kanner M.D.   On: 10/05/2018 17:24  IMPRESSION AND PLAN:   28 year old male with no significant past medical history other than recently being diagnosed with strep throat presents with intractable nausea vomiting.  1.  Intractable nausea vomiting-etiology unclear  presently.  Patient was recently diagnosed with strep throat but this has been adequately treated.  Patient CT abdomen pelvis is negative for acute pathology. - Could be possible underlying gastritis.  We will treat the patient supportively with IV fluids, antiemetics.  We will give the patient IV Pepcid, Maalox as needed. - We will observe overnight, placed on clear liquid diet.  If not improving consider getting GI consult.  2.  Hyponatremia- secondary to volume loss from the intractable nausea vomiting. -We will continue IV fluids, follow sodium level.  3.  Hypokalemia- secondary to the intractable nausea vomiting -We will replace patient's potassium orally and intravenously.  Repeat level in the morning.   Mg level is normal.   All the records are reviewed and case discussed with ED provider. Management plans discussed with the patient, family and they are in agreement.  CODE STATUS: full code  TOTAL TIME TAKING CARE OF THIS PATIENT: 40 minutes.    Houston SirenSAINANI,Cathyrn Deas J M.D on 10/05/2018 at 7:01 PM  Between 7am to 6pm - Pager - [phone]  After 6pm go to [email or web address] - password [email or web address]  [facility]  Office  [phone]  CC: Primary care physician; Patient, No Pcp Per

## 2018-10-06 LAB — BASIC METABOLIC PANEL
Anion gap: 8 (ref 5–15)
BUN: 17 mg/dL (ref 6–20)
CO2: 26 mmol/L (ref 22–32)
Calcium: 8.2 mg/dL — ABNORMAL LOW (ref 8.9–10.3)
Chloride: 100 mmol/L (ref 98–111)
Creatinine, Ser: 0.78 mg/dL (ref 0.61–1.24)
GFR calc Af Amer: >60 mL/min (ref >60)
GFR calc non Af Amer: >60 mL/min (ref >60)
GLUCOSE: 126 mg/dL — AB (ref 70–99)
Potassium: 3.2 mmol/L — ABNORMAL LOW (ref 3.5–5.1)
Sodium: 134 mmol/L — ABNORMAL LOW (ref 135–145)

## 2018-10-06 MED ORDER — POTASSIUM CHLORIDE 10 MEQ/100ML IV SOLN
10.0000 meq | INTRAVENOUS | Status: AC
  Administered 2018-10-06 (×2): 10 meq via INTRAVENOUS
  Filled 2018-10-06 (×2): qty 100

## 2018-10-06 MED ORDER — POTASSIUM CHLORIDE CRYS ER 20 MEQ PO TBCR
40.0000 meq | EXTENDED_RELEASE_TABLET | Freq: Once | ORAL | Status: DC
  Filled 2018-10-06: qty 2

## 2018-10-06 MED ORDER — PANTOPRAZOLE SODIUM 40 MG IV SOLR
40.0000 mg | Freq: Two times a day (BID) | INTRAVENOUS | Status: DC
  Administered 2018-10-06 (×2): 40 mg via INTRAVENOUS
  Filled 2018-10-06 (×2): qty 40

## 2018-10-06 NOTE — Progress Notes (Signed)
[employer] [facility] [place] [facility]                                                                                                                                                                                  Patient Demographics   James Sanchez, is a 28 y.o. male, DOB - 1991/04/20, ZOX:[id number]  Admit date - 10/05/2018   Admitting Physician Houston Siren, MD  Outpatient Primary MD for the patient is Patient, No Pcp Per   LOS - 0  Subjective: Patient continues to be nauseous and states his abdomen is cramping    Review of Systems:   CONSTITUTIONAL: No documented fever. No fatigue, weakness. No weight gain, no weight loss.  EYES: No blurry or double vision.  ENT: No tinnitus. No postnasal drip. No redness of the oropharynx.  RESPIRATORY: No cough, no wheeze, no hemoptysis. No dyspnea.  CARDIOVASCULAR: No chest pain. No orthopnea. No palpitations. No syncope.  GASTROINTESTINAL: Positive nausea, no vomiting or diarrhea.  Positive abdominal pain. No melena or hematochezia.  GENITOURINARY: No dysuria or hematuria.  ENDOCRINE: No polyuria or nocturia. No heat or cold intolerance.  HEMATOLOGY: No anemia. No bruising. No bleeding.  INTEGUMENTARY: No rashes. No lesions.  MUSCULOSKELETAL: No arthritis. No swelling. No gout.  NEUROLOGIC: No numbness, tingling, or ataxia. No seizure-type activity.  PSYCHIATRIC: No anxiety. No insomnia. No ADD.    Vitals:   Vitals:   10/05/18 1952 10/05/18 2011 10/06/18 0517 10/06/18 1138  BP: 131/62 136/69 119/68 (!) 101/49  Pulse: 79 87 75 90  Resp: Temp:  98.2 F (36.8 C) 97.8 F (36.6 C) 99.1 F (37.3 C)  TempSrc:  Oral Oral Oral  SpO2: 96% 97% 98% 97%  Weight:      Height:        Wt Readings from Last 3 Encounters:  10/05/18 127 kg  10/01/18 127 kg  04/26/16 113.4 kg     Intake/Output Summary (Last 24 hours) at 10/06/2018 1438 Last data filed at 10/06/2018 8119 Gross per 24 hour  Intake 2518.72 ml   Output 300 ml  Net 2218.72 ml    Physical Exam:   GENERAL: Pleasant-appearing in no apparent distress.  HEAD, EYES, EARS, NOSE AND THROAT: Atraumatic, normocephalic. Extraocular muscles are intact. Pupils equal and reactive to light. Sclerae anicteric. No conjunctival injection. No oro-pharyngeal erythema.  NECK: Supple. There is no jugular venous distention. No bruits, no lymphadenopathy, no thyromegaly.  HEART: Regular rate and rhythm,. No murmurs, no rubs, no clicks.  LUNGS: Clear to auscultation bilaterally. No rales or rhonchi. No wheezes.  ABDOMEN: Soft, flat, nontender, nondistended. Has good bowel sounds. No hepatosplenomegaly appreciated.  EXTREMITIES: No evidence of any cyanosis, clubbing, or peripheral edema.  +2 pedal and radial pulses bilaterally.  NEUROLOGIC: The patient is alert, awake, and oriented x3 with no focal motor or sensory deficits appreciated bilaterally.  SKIN: Moist and warm with no rashes appreciated.  Psych: Not anxious, depressed LN: No inguinal LN enlargement    Antibiotics   Anti-infectives (From admission, onward)   None      Medications   Scheduled Meds: . enoxaparin (LOVENOX) injection  40 mg Subcutaneous Q12H  . pantoprazole (PROTONIX) IV  40 mg Intravenous Q12H  . potassium chloride  40 mEq Oral Once  . potassium chloride  40 mEq Oral Once   Continuous Infusions: . 0.9 % NaCl with KCl 20 mEq / L 100 mL/hr at 10/06/18 0907  . potassium chloride 10 mEq (10/06/18 1226)   PRN Meds:.acetaminophen **OR** acetaminophen, alum & mag hydroxide-simeth, lidocaine, morphine injection, ondansetron **OR** ondansetron (ZOFRAN) IV   Data Review:   Micro Results Recent Results (from the past 240 hour(s))  Group A Strep by PCR ([facility] Only)     Status: Abnormal   Collection Time: 10/01/18 10:26 AM  Result Value Ref Range Status   Group A Strep by PCR DETECTED (A) NOT DETECTED Final    Comment: Performed at [facility], [address] [place], [place] [address]    Radiology Reports Dg Chest 2 View  Result Date: 10/05/2018 CLINICAL DATA:  Chest pain with nausea and vomiting EXAM: CHEST - 2 VIEW COMPARISON:  October 01, 2018 FINDINGS: Lungs are clear. Heart size and pulmonary vascularity are normal. No adenopathy. No pneumothorax. No bone lesions. IMPRESSION: No edema or consolidation. Electronically Signed   By: Bretta Bang III M.D.   On: 10/05/2018 14:53   Ct Abdomen Pelvis W Contrast  Result Date: 10/05/2018 CLINICAL DATA:  Severe abdominal pain. The patient reports she has not had a bowel movement for 9 days. EXAM: CT ABDOMEN AND PELVIS WITH CONTRAST TECHNIQUE: Multidetector CT imaging of the abdomen and pelvis was performed using the standard protocol following bolus administration of intravenous contrast. CONTRAST:  125 mL OMNIPAQUE IOHEXOL 300 MG/ML  SOLN COMPARISON:  None. FINDINGS: Lower chest: Lung bases clear.  No pleural or pericardial effusion. Hepatobiliary: No focal liver abnormality is seen. No gallstones, gallbladder wall thickening, or biliary dilatation. Pancreas: Unremarkable. No pancreatic ductal dilatation or surrounding inflammatory changes. Spleen: Normal in size without focal abnormality. Adrenals/Urinary Tract: Adrenal glands are unremarkable. Kidneys are normal, without renal calculi, focal lesion, or hydronephrosis. Bladder is unremarkable. Stomach/Bowel: Stomach is within normal limits. Appendix appears normal. No evidence of bowel wall thickening, distention, or inflammatory changes. Stool burden is normal. Vascular/Lymphatic: No significant vascular findings are present. No enlarged abdominal or pelvic lymph nodes. Reproductive: Prostate is unremarkable. Other: None. Musculoskeletal: No acute abnormality. 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 IMPRESSION: No acute abnormality or finding to explain the patient's symptoms. Stool burden appears normal.  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 Electronically Signed   By: Drusilla Kanner M.D.   On: 10/05/2018 17:24   Dg Chest Portable 1 View  Result Date: 10/01/2018 CLINICAL DATA:  28 year old male with a history cough fever EXAM: PORTABLE CHEST 1 VIEW COMPARISON:  04/26/2016 FINDINGS: The heart size and mediastinal contours are within normal limits. Both lungs are clear. The visualized skeletal structures are unremarkable. IMPRESSION: Negative for acute cardiopulmonary disease Electronically Signed   By: Gilmer Mor D.O.  On: 10/01/2018 12:25     CBC Recent Labs  Lab 10/01/18 1026 10/05/18 1124  WBC 15.0* 11.6*  HGB 15.1 15.9  HCT 43.3 46.3  PLT 446* 617*  MCV 82.5 83.6  MCH 28.8 28.7  MCHC 34.9 34.3  RDW 12.6 12.4    Chemistries  Recent Labs  Lab 10/01/18 1026 10/05/18 1124 10/06/18 0423  NA 132* 129* 134*  K 3.1* 3.0* 3.2*  CL 95* 92* 100  CO2 20* 22 26  GLUCOSE 116* 108* 126*  BUN 20 22* 17  CREATININE 0.88 0.84 0.78  CALCIUM 9.5 9.3 8.2*  MG  --  2.1  --   AST 106* 45*  --   ALT 69* 71*  --   ALKPHOS 103 77  --   BILITOT 2.7* 1.3*  --    ------------------------------------------------------------------------------------------------------------------ estimated creatinine clearance is 178.5 mL/min (by C-G formula based on SCr of 0.78 mg/dL). ------------------------------------------------------------------------------------------------------------------ No results for input(s): HGBA1C in the last 72 hours. ------------------------------------------------------------------------------------------------------------------ No results for input(s): CHOL, HDL, LDLCALC, TRIG, CHOLHDL, LDLDIRECT in the last 72 hours. ------------------------------------------------------------------------------------------------------------------ No results for input(s): TSH, T4TOTAL, T3FREE, THYROIDAB in the last 72 hours.  Invalid input(s):  FREET3 ------------------------------------------------------------------------------------------------------------------ No results for input(s): VITAMINB12, FOLATE, FERRITIN, TIBC, IRON, RETICCTPCT in the last 72 hours.  Coagulation profile No results for input(s): INR, PROTIME in the last 168 hours.  No results for input(s): DDIMER in the last 72 hours.  Cardiac Enzymes Recent Labs  Lab 10/05/18 1124  TROPONINI <0.03   ------------------------------------------------------------------------------------------------------------------ Invalid input(s): POCBNP    Assessment & Plan   28 year old male with no significant past medical history other than recently being diagnosed with strep throat presents with intractable nausea vomiting.  1.  Intractable nausea vomiting- still symptomatic we will try to advance his diet change his Pepcid to Protonix IV twice daily   2.  Hyponatremia- secondary to volume loss from the intractable nausea vomiting. Improved with IV fluids   3.  Hypokalemia- secondary to the intractable nausea vomiting -Patient refused oral so I have to give him IV dose   Possible discharge if doing better later     Code Status Orders  (From admission, onward)         Start     Ordered   10/05/18 2009  Full code  Continuous     10/05/18 2008        Code Status History    This patient has a current code status but no historical code status.           Consultsnone  DVT Prophylaxis  Lovenox    Lab Results  Component Value Date   PLT 617 (H) 10/05/2018     Time Spent in minutes   Greater than 50% of time spent in care coordination and counseling patient regarding the condition and plan of care.   Auburn Bilberry M.D on 10/06/2018 at 2:38 PM  Between 7am to 6pm - Pager - [phone]  After 6pm go to [email or web address] - Social research officer, government  [employer]   Office  [phone]

## 2018-10-07 LAB — HIV ANTIBODY (ROUTINE TESTING W REFLEX): HIV Screen 4th Generation wRfx: NONREACTIVE

## 2018-10-07 NOTE — Discharge Summary (Signed)
[employer] - [place] at [facility]   PATIENT NAME: James Sanchez    MR#:  [id number]  DATE OF BIRTH:  18-Jan-1991  DATE OF ADMISSION:  10/05/2018 ADMITTING PHYSICIAN: Houston Siren, MD  DATE OF DISCHARGE: 10/07/2018  1:22 PM  PRIMARY CARE PHYSICIAN: Patient, No Pcp Per    ADMISSION DIAGNOSIS:  Hypokalemia [E87.6] Strep pharyngitis [J02.0] Intractable nausea and vomiting [R11.2]  DISCHARGE DIAGNOSIS:  Active Problems:   Intractable nausea and vomiting   SECONDARY DIAGNOSIS:  History reviewed. No pertinent past medical history.  HOSPITAL COURSE:   27 year old male with no significant past medical history other than recently being diagnosed with strep throat presents with intractable nausea vomiting.  1. Intractable nausea vomiting -he was admitted overnight and treated conservatively with IV fluids, antiemetics.  Patient was given IV Protonix. -He has clinically improved and is tolerating p.o. well therefore being discharged home. -Patient CT scan of the abdomen pelvis on admission was negative for any acute pathology.  2. Hyponatremia- secondary to volume loss from the intractable nausea vomiting. -Normalized with IV fluid hydration.  3. Hypokalemia- secondary to the intractable nausea vomiting -Improved and resolved with supplementation.  Patient is stable to be discharged home.  DISCHARGE CONDITIONS:   Stable  CONSULTS OBTAINED:    DRUG ALLERGIES:   Allergies  Allergen Reactions  . Aspirin Nausea And Vomiting    As child per mother  . Sulfa Antibiotics Nausea And Vomiting    As child per mother    DISCHARGE MEDICATIONS:   Allergies as of 10/07/2018      Reactions   Aspirin Nausea And Vomiting   As child per mother   Sulfa Antibiotics Nausea And Vomiting   As child per mother      Medication List    STOP taking these medications   predniSONE 20 MG tablet Commonly known as:  DELTASONE     TAKE these medications    ibuprofen 600 MG tablet Commonly known as:  ADVIL,MOTRIN Take 1 tablet (600 mg total) by mouth every 6 (six) hours as needed.   lidocaine 2 % solution Commonly known as:  XYLOCAINE Use as directed 15 mLs in the mouth or throat as needed for mouth pain.   ondansetron 4 MG disintegrating tablet Commonly known as:  ZOFRAN ODT Take 1 tablet (4 mg total) by mouth every 8 (eight) hours as needed.         DISCHARGE INSTRUCTIONS:   DIET:  Regular diet  DISCHARGE CONDITION:  Stable  ACTIVITY:  Activity as tolerated  OXYGEN:  Home Oxygen: No.   Oxygen Delivery: room air  DISCHARGE LOCATION:  home   If you experience worsening of your admission symptoms, develop shortness of breath, life threatening emergency, suicidal or homicidal thoughts you must seek medical attention immediately by calling 911 or calling your MD immediately  if symptoms less severe.  You Must read complete instructions/literature along with all the possible adverse reactions/side effects for all the Medicines you take and that have been prescribed to you. Take any new Medicines after you have completely understood and accpet all the possible adverse reactions/side effects.   Please note  You were cared for by a hospitalist during your hospital stay. If you have any questions about your discharge medications or the care you received while you were in the hospital after you are discharged, you can call the unit and asked to speak with the hospitalist on call if the hospitalist that took care of you is  not available. Once you are discharged, your primary care physician will handle any further medical issues. Please note that NO REFILLS for any discharge medications will be authorized once you are discharged, as it is imperative that you return to your primary care physician (or establish a relationship with a primary care physician if you do not have one) for your aftercare needs so that they can reassess your need  for medications and monitor your lab values.     Today   Abdominal pain, nausea or vomiting this morning.  Clinically patient feels better.  Will discharge home today.  VITAL SIGNS:  Blood pressure 127/65, pulse 85, temperature 98 F (36.7 C), temperature source Oral, resp. rate 20, height 5\' 8"  (1.727 m), weight 127 kg, SpO2 100 %.  I/O:    Intake/Output Summary (Last 24 hours) at 10/07/2018 1613 Last data filed at 10/07/2018 0500 Gross per 24 hour  Intake 1554.72 ml  Output 450 ml  Net 1104.72 ml    PHYSICAL EXAMINATION:  GENERAL:  28 y.o.-year-old patient lying in the bed with no acute distress.  EYES: Pupils equal, round, reactive to light and accommodation. No scleral icterus. Extraocular muscles intact.  HEENT: Head atraumatic, normocephalic. Oropharynx and nasopharynx clear.  NECK:  Supple, no jugular venous distention. No thyroid enlargement, no tenderness.  LUNGS: Normal breath sounds bilaterally, no wheezing, rales,rhonchi. No use of accessory muscles of respiration.  CARDIOVASCULAR: S1, S2 normal. No murmurs, rubs, or gallops.  ABDOMEN: Soft, non-tender, non-distended. Bowel sounds present. No organomegaly or mass.  EXTREMITIES: No pedal edema, cyanosis, or clubbing.  NEUROLOGIC: Cranial nerves II through XII are intact. No focal motor or sensory defecits b/l.  PSYCHIATRIC: The patient is alert and oriented x 3.  SKIN: No obvious rash, lesion, or ulcer.   DATA REVIEW:   CBC Recent Labs  Lab 10/05/18 1124  WBC 11.6*  HGB 15.9  HCT 46.3  PLT 617*    Chemistries  Recent Labs  Lab 10/05/18 1124 10/06/18 0423  NA 129* 134*  K 3.0* 3.2*  CL 92* 100  CO2 22 26  GLUCOSE 108* 126*  BUN 22* 17  CREATININE 0.84 0.78  CALCIUM 9.3 8.2*  MG 2.1  --   AST 45*  --   ALT 71*  --   ALKPHOS 77  --   BILITOT 1.3*  --     Cardiac Enzymes Recent Labs  Lab 10/05/18 1124  TROPONINI <0.03    Microbiology Results  Results for orders placed or performed  during the hospital encounter of 10/01/18  Group A Strep by PCR ([facility] Only)     Status: Abnormal   Collection Time: 10/01/18 10:26 AM  Result Value Ref Range Status   Group A Strep by PCR DETECTED (A) NOT DETECTED Final    Comment: Performed at [facility], [address] [place], [place] [address]    RADIOLOGY:  Ct Abdomen Pelvis W Contrast  Result Date: 10/05/2018 CLINICAL DATA:  Severe abdominal pain. The patient reports she has not had a bowel movement for 9 days. EXAM: CT ABDOMEN AND PELVIS WITH CONTRAST TECHNIQUE: Multidetector CT imaging of the abdomen and pelvis was performed using the standard protocol following bolus administration of intravenous contrast. CONTRAST:  125 mL OMNIPAQUE IOHEXOL 300 MG/ML  SOLN COMPARISON:  None. FINDINGS: Lower chest: Lung bases clear.  No pleural or pericardial effusion. Hepatobiliary: No focal liver abnormality is seen. No gallstones, gallbladder wall thickening, or biliary dilatation. Pancreas: Unremarkable. No pancreatic ductal dilatation  or surrounding inflammatory changes. Spleen: Normal in size without focal abnormality. Adrenals/Urinary Tract: Adrenal glands are unremarkable. Kidneys are normal, without renal calculi, focal lesion, or hydronephrosis. Bladder is unremarkable. Stomach/Bowel: Stomach is within normal limits. Appendix appears normal. No evidence of bowel wall thickening, distention, or inflammatory changes. Stool burden is normal. Vascular/Lymphatic: No significant vascular findings are present. No enlarged abdominal or pelvic lymph nodes. Reproductive: Prostate is unremarkable. Other: None. Musculoskeletal: No acute abnormality. 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 IMPRESSION: No acute abnormality or finding to explain the patient's symptoms. Stool burden appears normal. 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 Electronically Signed   By:  Drusilla Kanner M.D.   On: 10/05/2018 17:24      Management plans discussed with the patient, family and they are in agreement.  CODE STATUS:     Code Status Orders  (From admission, onward)         Start     Ordered   10/05/18 2009  Full code  Continuous     10/05/18 2008        Code Status History    This patient has a current code status but no historical code status.      TOTAL TIME TAKING CARE OF THIS PATIENT: 40 minutes.    Houston Siren M.D on 10/07/2018 at 4:13 PM  Between 7am to 6pm - Pager - [phone]  After 6pm go to [email or web address] - Social research officer, government  [employer] [facility]  Office  [phone]  CC: Primary care physician; Patient, No Pcp Per

## 2018-10-07 NOTE — Progress Notes (Signed)
James Sanchez  A and O x 4. VSS. Pt tolerating diet well. No complaints of pain or nausea. IV removed intact, prescriptions given. Pt voiced understanding of discharge instructions with no further questions.   Allergies as of 10/07/2018      Reactions   Aspirin Nausea And Vomiting   As child per mother   Sulfa Antibiotics Nausea And Vomiting   As child per mother      Medication List    STOP taking these medications   predniSONE 20 MG tablet Commonly known as:  DELTASONE     TAKE these medications   ibuprofen 600 MG tablet Commonly known as:  ADVIL,MOTRIN Take 1 tablet (600 mg total) by mouth every 6 (six) hours as needed.   lidocaine 2 % solution Commonly known as:  XYLOCAINE Use as directed 15 mLs in the mouth or throat as needed for mouth pain.   ondansetron 4 MG disintegrating tablet Commonly known as:  ZOFRAN ODT Take 1 tablet (4 mg total) by mouth every 8 (eight) hours as needed.       Vitals:   10/06/18 2013 10/07/18 0525  BP: (!) 143/70 127/65  Pulse: 84 85  Resp: 20 20  Temp: 98.9 F (37.2 C) 98 F (36.7 C)  SpO2: 98% 100%    Suzzanne Cloud

## 2018-10-09 ENCOUNTER — Emergency Department: Payer: Self-pay

## 2018-10-09 ENCOUNTER — Observation Stay
Admission: EM | Admit: 2018-10-09 | Discharge: 2018-10-11 | Disposition: A | Discharge status: Home / Self Care | Payer: Self-pay | Attending: Gastroenterology | Admitting: Gastroenterology

## 2018-10-09 ENCOUNTER — Encounter: Payer: Self-pay | Admitting: Emergency Medicine

## 2018-10-09 ENCOUNTER — Other Ambulatory Visit: Payer: Self-pay

## 2018-10-09 DIAGNOSIS — J189 Pneumonia, unspecified organism: Secondary | ICD-10-CM

## 2018-10-09 DIAGNOSIS — Z882 Allergy status to sulfonamides status: Secondary | ICD-10-CM | POA: Insufficient documentation

## 2018-10-09 DIAGNOSIS — Z881 Allergy status to other antibiotic agents status: Secondary | ICD-10-CM | POA: Insufficient documentation

## 2018-10-09 DIAGNOSIS — E876 Hypokalemia: Secondary | ICD-10-CM | POA: Insufficient documentation

## 2018-10-09 DIAGNOSIS — R1115 Cyclical vomiting syndrome unrelated to migraine: Principal | ICD-10-CM | POA: Insufficient documentation

## 2018-10-09 DIAGNOSIS — R1013 Epigastric pain: Secondary | ICD-10-CM | POA: Insufficient documentation

## 2018-10-09 DIAGNOSIS — R1010 Upper abdominal pain, unspecified: Secondary | ICD-10-CM

## 2018-10-09 DIAGNOSIS — R112 Nausea with vomiting, unspecified: Secondary | ICD-10-CM

## 2018-10-09 DIAGNOSIS — F1290 Cannabis use, unspecified, uncomplicated: Secondary | ICD-10-CM | POA: Insufficient documentation

## 2018-10-09 DIAGNOSIS — Z79899 Other long term (current) drug therapy: Secondary | ICD-10-CM | POA: Insufficient documentation

## 2018-10-09 DIAGNOSIS — Z886 Allergy status to analgesic agent status: Secondary | ICD-10-CM | POA: Insufficient documentation

## 2018-10-09 DIAGNOSIS — E871 Hypo-osmolality and hyponatremia: Secondary | ICD-10-CM | POA: Insufficient documentation

## 2018-10-09 DIAGNOSIS — F17200 Nicotine dependence, unspecified, uncomplicated: Secondary | ICD-10-CM | POA: Insufficient documentation

## 2018-10-09 DIAGNOSIS — R109 Unspecified abdominal pain: Secondary | ICD-10-CM

## 2018-10-09 DIAGNOSIS — Z791 Long term (current) use of non-steroidal anti-inflammatories (NSAID): Secondary | ICD-10-CM | POA: Insufficient documentation

## 2018-10-09 LAB — COMPREHENSIVE METABOLIC PANEL
ALT: 51 U/L — ABNORMAL HIGH (ref 0–44)
AST: 31 U/L (ref 15–41)
Albumin: 3.9 g/dL (ref 3.5–5.0)
Alkaline Phosphatase: 60 U/L (ref 38–126)
Anion gap: 12 (ref 5–15)
BUN: 11 mg/dL (ref 6–20)
CALCIUM: 9.3 mg/dL (ref 8.9–10.3)
CO2: 21 mmol/L — ABNORMAL LOW (ref 22–32)
Chloride: 102 mmol/L (ref 98–111)
Creatinine, Ser: 0.81 mg/dL (ref 0.61–1.24)
GFR calc Af Amer: >60 mL/min (ref >60)
GFR calc non Af Amer: >60 mL/min (ref >60)
Glucose, Bld: 100 mg/dL — ABNORMAL HIGH (ref 70–99)
Potassium: 3.2 mmol/L — ABNORMAL LOW (ref 3.5–5.1)
Sodium: 135 mmol/L (ref 135–145)
Total Bilirubin: 1.1 mg/dL (ref 0.3–1.2)
Total Protein: 8.6 g/dL — ABNORMAL HIGH (ref 6.5–8.1)

## 2018-10-09 LAB — CBC WITH DIFFERENTIAL/PLATELET
Abs Immature Granulocytes: 0.06 10*3/uL (ref 0.00–0.07)
Basophils Absolute: 0 10*3/uL (ref 0.0–0.1)
Basophils Relative: 0 %
EOS PCT: 1 %
Eosinophils Absolute: 0.1 10*3/uL (ref 0.0–0.5)
HCT: 40.8 % (ref 39.0–52.0)
Hemoglobin: 14 g/dL (ref 13.0–17.0)
Immature Granulocytes: 1 %
Lymphocytes Relative: 22 %
Lymphs Abs: 2 10*3/uL (ref 0.7–4.0)
MCH: 28.7 pg (ref 26.0–34.0)
MCHC: 34.3 g/dL (ref 30.0–36.0)
MCV: 83.6 fL (ref 80.0–100.0)
Monocytes Absolute: 0.6 10*3/uL (ref 0.1–1.0)
Monocytes Relative: 7 %
Neutro Abs: 6.4 10*3/uL (ref 1.7–7.7)
Neutrophils Relative %: 69 %
Platelets: 452 10*3/uL — ABNORMAL HIGH (ref 150–400)
RBC: 4.88 MIL/uL (ref 4.22–5.81)
RDW: 12.8 % (ref 11.5–15.5)
WBC: 9.2 10*3/uL (ref 4.0–10.5)
nRBC: 0 % (ref 0.0–0.2)

## 2018-10-09 LAB — MAGNESIUM: Magnesium: 1.9 mg/dL (ref 1.7–2.4)

## 2018-10-09 LAB — LIPASE, BLOOD: Lipase: 42 U/L (ref 11–51)

## 2018-10-09 LAB — LACTIC ACID, PLASMA: Lactic Acid, Venous: 1.3 mmol/L (ref 0.5–1.9)

## 2018-10-09 LAB — SEDIMENTATION RATE: Sed Rate: 52 mm/hr — ABNORMAL HIGH (ref 0–15)

## 2018-10-09 MED ORDER — PANTOPRAZOLE SODIUM 40 MG IV SOLR
40.0000 mg | Freq: Two times a day (BID) | INTRAVENOUS | Status: DC
  Administered 2018-10-09 – 2018-10-10 (×2): 40 mg via INTRAVENOUS
  Filled 2018-10-09 (×2): qty 40

## 2018-10-09 MED ORDER — LEVOFLOXACIN IN D5W 750 MG/150ML IV SOLN
750.0000 mg | INTRAVENOUS | Status: DC
  Filled 2018-10-09 (×2): qty 150

## 2018-10-09 MED ORDER — SODIUM CHLORIDE 0.9 % IV SOLN
INTRAVENOUS | Status: DC | PRN
  Administered 2018-10-09 (×2): 250 mL via INTRAVENOUS

## 2018-10-09 MED ORDER — HYDROCODONE-ACETAMINOPHEN 5-325 MG PO TABS: 1.0000 | ORAL_TABLET | ORAL | 0 refills | Status: DC | PRN

## 2018-10-09 MED ORDER — MORPHINE SULFATE (PF) 2 MG/ML IV SOLN
2.0000 mg | INTRAVENOUS | Status: DC | PRN
  Administered 2018-10-09 – 2018-10-11 (×4): 2 mg via INTRAVENOUS
  Filled 2018-10-09 (×4): qty 1

## 2018-10-09 MED ORDER — ENOXAPARIN SODIUM 40 MG/0.4ML ~~LOC~~ SOLN
40.0000 mg | SUBCUTANEOUS | Status: DC
  Administered 2018-10-09: 40 mg via SUBCUTANEOUS
  Filled 2018-10-09 (×2): qty 0.4

## 2018-10-09 MED ORDER — SODIUM CHLORIDE 0.9 % IV BOLUS
1000.0000 mL | Freq: Once | INTRAVENOUS | Status: AC
  Administered 2018-10-09: 1000 mL via INTRAVENOUS

## 2018-10-09 MED ORDER — MORPHINE SULFATE (PF) 4 MG/ML IV SOLN
4.0000 mg | Freq: Once | INTRAVENOUS | Status: AC
  Administered 2018-10-09: 4 mg via INTRAVENOUS
  Filled 2018-10-09: qty 1

## 2018-10-09 MED ORDER — HYDROCODONE-ACETAMINOPHEN 5-325 MG PO TABS: 1.0000 | ORAL_TABLET | ORAL | Status: DC | PRN

## 2018-10-09 MED ORDER — SODIUM CHLORIDE 0.9 % IV SOLN
INTRAVENOUS | Status: DC
  Administered 2018-10-09 – 2018-10-10 (×3): via INTRAVENOUS

## 2018-10-09 MED ORDER — IOHEXOL 350 MG/ML SOLN
100.0000 mL | Freq: Once | INTRAVENOUS | Status: AC | PRN
  Administered 2018-10-09: 100 mL via INTRAVENOUS

## 2018-10-09 MED ORDER — IOPAMIDOL (ISOVUE-300) INJECTION 61%
30.0000 mL | Freq: Once | INTRAVENOUS | Status: AC | PRN
  Administered 2018-10-09: 30 mL via ORAL

## 2018-10-09 MED ORDER — LEVOFLOXACIN 500 MG PO TABS: 500.0000 mg | ORAL_TABLET | Freq: Every day | ORAL | 0 refills | Status: DC

## 2018-10-09 MED ORDER — ONDANSETRON HCL 4 MG/2ML IJ SOLN
4.0000 mg | Freq: Four times a day (QID) | INTRAMUSCULAR | Status: DC | PRN
  Administered 2018-10-09: 4 mg via INTRAVENOUS
  Filled 2018-10-09 (×2): qty 2

## 2018-10-09 MED ORDER — ONDANSETRON HCL 4 MG PO TABS: 4.0000 mg | ORAL_TABLET | Freq: Four times a day (QID) | ORAL | Status: DC | PRN

## 2018-10-09 MED ORDER — HYDROMORPHONE HCL 1 MG/ML IJ SOLN
0.5000 mg | Freq: Once | INTRAMUSCULAR | Status: AC
  Administered 2018-10-09: 0.5 mg via INTRAVENOUS
  Filled 2018-10-09: qty 1

## 2018-10-09 MED ORDER — ACETAMINOPHEN 325 MG PO TABS: 650.0000 mg | ORAL_TABLET | Freq: Four times a day (QID) | ORAL | Status: DC | PRN

## 2018-10-09 MED ORDER — ONDANSETRON HCL 4 MG/2ML IJ SOLN
4.0000 mg | Freq: Once | INTRAMUSCULAR | Status: AC
  Administered 2018-10-09: 4 mg via INTRAVENOUS
  Filled 2018-10-09: qty 2

## 2018-10-09 MED ORDER — PROMETHAZINE HCL 25 MG/ML IJ SOLN
12.5000 mg | Freq: Once | INTRAMUSCULAR | Status: AC
  Administered 2018-10-09: 12.5 mg via INTRAVENOUS
  Filled 2018-10-09: qty 1

## 2018-10-09 MED ORDER — POLYETHYLENE GLYCOL 3350 17 G PO PACK: 17.0000 g | PACK | Freq: Every day | ORAL | Status: DC | PRN

## 2018-10-09 MED ORDER — POTASSIUM CHLORIDE 10 MEQ/100ML IV SOLN
10.0000 meq | INTRAVENOUS | Status: AC
  Administered 2018-10-09 (×2): 10 meq via INTRAVENOUS
  Filled 2018-10-09 (×6): qty 100

## 2018-10-09 MED ORDER — ACETAMINOPHEN 650 MG RE SUPP: 650.0000 mg | Freq: Four times a day (QID) | RECTAL | Status: DC | PRN

## 2018-10-09 NOTE — ED Notes (Signed)
Pt unable to drink CT contrast.  DR Darnelle Catalan notified and CT called to scan patient without oral contrast.

## 2018-10-09 NOTE — ED Provider Notes (Signed)
[facility] Emergency Department Provider Note   ____________________________________________   First MD Initiated Contact with Patient 10/09/18 1035     (approximate)  I have reviewed the triage vital signs and the nursing notes.   HISTORY  Chief Complaint Abdominal Pain    H Keiandre Sanchez is a 28 y.o. male who reports 2 to 3 days of gradually increasing belly pain.  He says it is crampy and diffuse.  He says is very severe.  While I am examining him the nurse begins to put an IV in his left hand.  This gentleman who has tattoos all over both of his arms is writhing in pain from the IV site is the worst pain he is ever had.  Seems to be hurting him more than his belly.  Patient was recently in the hospital 4 days ago for intractable vomiting with abdominal pain 2.   History reviewed. No pertinent past medical history.  Patient Active Problem List   Diagnosis Date Noted  . Abdominal pain 10/09/2018  . Intractable nausea and vomiting 10/05/2018    History reviewed. No pertinent surgical history.  Prior to Admission medications   Medication Sig Start Date End Date Taking? Authorizing Provider  ibuprofen (ADVIL,MOTRIN) 600 MG tablet Take 1 tablet (600 mg total) by mouth every 6 (six) hours as needed. 10/01/18  Yes Veronese, [place], MD  lidocaine (XYLOCAINE) 2 % solution Use as directed 15 mLs in the mouth or throat as needed for mouth pain. 10/01/18  Yes Don Perking, [place], MD  ondansetron (ZOFRAN ODT) 4 MG disintegrating tablet Take 1 tablet (4 mg total) by mouth every 8 (eight) hours as needed. 10/01/18  Yes Don Perking, [place], MD  HYDROcodone-acetaminophen [facility]) 5-325 MG tablet Take 1 tablet by mouth every 4 (four) hours as needed for moderate pain. 10/09/18   Arnaldo Natal, MD  levofloxacin (LEVAQUIN) 500 MG tablet Take 1 tablet (500 mg total) by mouth daily for 10 days. 10/09/18 10/19/18  Arnaldo Natal, MD    Allergies Aspirin and Sulfa  antibiotics  Family History  Problem Relation Age of Onset  . Asthma Mother   . Diabetes Maternal Grandmother   . Diabetes Maternal Grandfather   . Diabetes Paternal Grandmother   . Diabetes Paternal Grandfather     Social History Social History   Tobacco Use  . Smoking status: Current Every Day Smoker  . Smokeless tobacco: Never Used  Substance Use Topics  . Alcohol use: No  . Drug use: Yes    Types: Marijuana    Comment: 2 weeks ago.     Review of Systems  Constitutional: No fever/chills Eyes: No visual changes. ENT: No sore throat. Cardiovascular: Denies chest pain. Respiratory: Denies shortness of breath. Gastrointestinal:  abdominal pain.  No nausea, no vomiting.  No diarrhea.  No constipation. Genitourinary: Negative for dysuria. Musculoskeletal: Negative for back pain. Skin: Negative for rash. Neurological: Negative for headaches, focal weakness  ____________________________________________   PHYSICAL EXAM:  VITAL SIGNS: ED Triage Vitals  Enc Vitals Group     BP 10/09/18 1026 (!) 132/100     Pulse Rate 10/09/18 1026 (!) 110     Resp 10/09/18 1026 18     Temp 10/09/18 1026 99 F (37.2 C)     Temp Source 10/09/18 1026 Oral     SpO2 10/09/18 1026 98 %     Weight 10/09/18 1027 279 lb 15.8 oz (127 kg)     Height --  Head Circumference --      Peak Flow --      Pain Score 10/09/18 1026 10     Pain Loc --      Pain Edu? --      Excl. in GC? --    Constitutional: Alert and oriented.  Patient writhing in pain on the bed from the IV. Eyes: Conjunctivae are normal.  Head: Atraumatic. Nose: No congestion/rhinnorhea. Mouth/Throat: Mucous membranes are moist.  Oropharynx non-erythematous. Neck: No stridor.  Cardiovascular: Normal rate, regular rhythm. Grossly normal heart sounds.  Good peripheral circulation. Respiratory: Normal respiratory effort.  No retractions. Lungs CTAB. Gastrointestinal: Soft usually tender to palpation and percussion no  distention. No abdominal bruits. . Musculoskeletal: No lower extremity tenderness nor edema.  Monitoring related bracelet on the l right ankle Neurologic:  Normal speech and language. No gross focal neurologic deficits are appreciated. Skin:  Skin is warm, dry and intact. No rash noted.   ____________________________________________   LABS (all labs ordered are listed, but only abnormal results are displayed)  Labs Reviewed  COMPREHENSIVE METABOLIC PANEL - Abnormal; Notable for the following components:      Result Value   Potassium 3.2 (*)    CO2 21 (*)    Glucose, Bld 100 (*)    Total Protein 8.6 (*)    ALT 51 (*)    All other components within normal limits  CBC WITH DIFFERENTIAL/PLATELET - Abnormal; Notable for the following components:   Platelets 452 (*)    All other components within normal limits  SEDIMENTATION RATE - Abnormal; Notable for the following components:   Sed Rate 52 (*)    All other components within normal limits  LACTIC ACID, PLASMA  LIPASE, BLOOD  URINALYSIS, COMPLETE (UACMP) WITH MICROSCOPIC  URINE DRUG SCREEN, QUALITATIVE ([facility] ONLY)  CBC  CREATININE, SERUM  MAGNESIUM   ____________________________________________  EKG   ____________________________________________  RADIOLOGY  ED MD interpretation:    Official radiology report(s): Ct Abdomen Pelvis W Contrast  Result Date: 10/09/2018 CLINICAL DATA:  Abdominal pain EXAM: CT ABDOMEN AND PELVIS WITH CONTRAST TECHNIQUE: Multidetector CT imaging of the abdomen and pelvis was performed using the standard protocol following bolus administration of intravenous contrast. CONTRAST:  OMNIPAQUE IOHEXOL 350 MG/ML SOLN COMPARISON:  10/05/2018 FINDINGS: Lower chest: 4 mm left lower lobe pulmonary nodule stable dating back to 2017. Focal patchy ground-glass in the left lower lobe on image 13 measures 1.2 cm. Hepatobiliary: Unremarkable Pancreas: Unremarkable Spleen: Unremarkable Adrenals/Urinary Tract:  Unremarkable Stomach/Bowel: Normal appendix. No obvious mass in the colon. Small bowel is decompressed. Stomach is unremarkable. Vascular/Lymphatic: No evidence of aortic aneurysm. No abnormal retroperitoneal adenopathy. Reproductive: Normal prostate. Other: No free fluid. Musculoskeletal: L5 pars defects.  No vertebral compression. IMPRESSION: No acute intra-abdominal pathology. 1.2 cm focus of ground-glass in the left lower lobe. An inflammatory process is not excluded. Electronically Signed   By: Jolaine Click M.D.   On: 10/09/2018 14:04    ____________________________________________   PROCEDURES  Procedure(s) performed (including Critical Care):  Procedures   ____________________________________________   INITIAL IMPRESSION / ASSESSMENT AND PLAN / ED COURSE  Patient complains of continued nausea and vomiting worse abdominal pain when he tries to eat.  He does have a teeny focus of pneumonia.  I was going to try and discharge him but he cannot keep anything down he says.  And trying to drink makes him much worse.  We will admit him and watch him at least overnight.  ____________________________________________   FINAL CLINICAL IMPRESSION(S) / ED DIAGNOSES  Final diagnoses:  Abdominal pain, unspecified abdominal location     ED Discharge Orders         Ordered    levofloxacin (LEVAQUIN) 500 MG tablet  Daily     10/09/18 1418    HYDROcodone-acetaminophen (NORCO) 5-325 MG tablet  Every 4 hours PRN     10/09/18 1418           Note:  This document was prepared using Dragon voice recognition software and may include unintentional dictation errors.    Arnaldo Natal, MD 10/09/18 [phone]

## 2018-10-09 NOTE — H&P (Signed)
[facility] [place] [facility]   PATIENT NAME: James Sanchez    MR#:  [id number]  DATE OF BIRTH:  [birth year]  DATE OF ADMISSION:  10/09/2018  PRIMARY CARE PHYSICIAN: Patient, No Pcp Per   REQUESTING/REFERRING PHYSICIAN: dr Darnelle Catalan  CHIEF COMPLAINT:   Nausea and vomiting with abdominal pain.  HISTORY OF PRESENT ILLNESS:  James Sanchez  is a 28 y.o. male with no PMHX who was discharged a few days ago from the hospital due to intractable nausea and vomiting which improved.  He reports since that time he has had on and off abdominal pain with nausea and vomiting.  He is currently writhing in pain.  CT at that time did not show acute pathology and CAT scan of the abdomen today does not show acute pathology.  It does show a small possible pneumonia on the left lower lobe.  PAST MEDICAL HISTORY:  None  PAST SURGICAL HISTORY:    SOCIAL HISTORY:   Social History   Tobacco Use  . Smoking status: Current Every Day Smoker  . Smokeless tobacco: Never Used  Substance Use Topics  . Alcohol use: No    FAMILY HISTORY:   Family History  Problem Relation Age of Onset  . Asthma Mother   . Diabetes Maternal Grandmother   . Diabetes Maternal Grandfather   . Diabetes Paternal Grandmother   . Diabetes Paternal Grandfather     DRUG ALLERGIES:   Allergies  Allergen Reactions  . Aspirin Nausea And Vomiting    As child per mother  . Sulfa Antibiotics Nausea And Vomiting    As child per mother    REVIEW OF SYSTEMS:   Review of Systems  Constitutional: Negative.  Negative for chills, fever and malaise/fatigue.  HENT: Negative.  Negative for ear discharge, ear pain, hearing loss, nosebleeds and sore throat.   Eyes: Negative.  Negative for blurred vision and pain.  Respiratory: Negative.  Negative for cough, hemoptysis, shortness of breath and wheezing.   Cardiovascular: Negative.  Negative for chest pain, palpitations and leg swelling.  Gastrointestinal: Positive  for abdominal pain, nausea and vomiting. Negative for blood in stool and diarrhea.  Genitourinary: Negative.  Negative for dysuria.  Musculoskeletal: Negative.  Negative for back pain.  Skin: Negative.   Neurological: Negative for dizziness, tremors, speech change, focal weakness, seizures and headaches.  Endo/Heme/Allergies: Negative.  Does not bruise/bleed easily.  Psychiatric/Behavioral: Negative.  Negative for depression, hallucinations and suicidal ideas.    MEDICATIONS AT HOME:   Prior to Admission medications   Medication Sig Start Date End Date Taking? Authorizing Provider  ibuprofen (ADVIL,MOTRIN) 600 MG tablet Take 1 tablet (600 mg total) by mouth every 6 (six) hours as needed. 10/01/18  Yes Veronese, [place], MD  lidocaine (XYLOCAINE) 2 % solution Use as directed 15 mLs in the mouth or throat as needed for mouth pain. 10/01/18  Yes Don Perking, [place], MD  ondansetron (ZOFRAN ODT) 4 MG disintegrating tablet Take 1 tablet (4 mg total) by mouth every 8 (eight) hours as needed. 10/01/18  Yes Don Perking, [place], MD  HYDROcodone-acetaminophen [facility]) 5-325 MG tablet Take 1 tablet by mouth every 4 (four) hours as needed for moderate pain. 10/09/18   Arnaldo Natal, MD  levofloxacin (LEVAQUIN) 500 MG tablet Take 1 tablet (500 mg total) by mouth daily for 10 days. 10/09/18 10/19/18  Arnaldo Natal, MD      VITAL SIGNS:  Blood pressure (!) 130/52, pulse (!) 102, temperature 98 F (36.7 C), temperature source  Oral, resp. rate 14, weight 127 kg, SpO2 95 %.  PHYSICAL EXAMINATION:   Physical Exam Constitutional:      General: He is in acute distress.  HENT:     Head: Normocephalic.  Eyes:     General: No scleral icterus. Neck:     Musculoskeletal: Normal range of motion and neck supple.     Vascular: No JVD.     Trachea: No tracheal deviation.  Cardiovascular:     Rate and Rhythm: Normal rate and regular rhythm.     Heart sounds: Normal heart sounds. No murmur. No friction rub. No  gallop.   Pulmonary:     Effort: Pulmonary effort is normal. No respiratory distress.     Breath sounds: Normal breath sounds. No wheezing or rales.  Chest:     Chest wall: No tenderness.  Abdominal:     General: Bowel sounds are normal. There is no distension.     Palpations: Abdomen is soft. There is no mass.     Tenderness: There is generalized abdominal tenderness. There is no guarding or rebound.  Musculoskeletal: Normal range of motion.  Skin:    General: Skin is warm.     Findings: No erythema or rash.  Neurological:     Mental Status: He is alert and oriented to person, place, and time.  Psychiatric:        Mood and Affect: Mood is anxious.        Judgment: Judgment normal.       LABORATORY PANEL:   CBC Recent Labs  Lab 10/09/18 1100  WBC 9.2  HGB 14.0  HCT 40.8  PLT 452*   ------------------------------------------------------------------------------------------------------------------  Chemistries  Recent Labs  Lab 10/05/18 1124  10/09/18 1100  NA 129*   < > 135  K 3.0*   < > 3.2*  CL 92*   < > 102  CO2 22   < > 21*  GLUCOSE 108*   < > 100*  BUN 22*   < > 11  CREATININE 0.84   < > 0.81  CALCIUM 9.3   < > 9.3  MG 2.1  --   --   AST 45*  --  31  ALT 71*  --  51*  ALKPHOS 77  --  60  BILITOT 1.3*  --  1.1   < > = values in this interval not displayed.   ------------------------------------------------------------------------------------------------------------------  Cardiac Enzymes Recent Labs  Lab 10/05/18 1124  TROPONINI <0.03   ------------------------------------------------------------------------------------------------------------------  RADIOLOGY:  Ct Abdomen Pelvis W Contrast  Result Date: 10/09/2018 CLINICAL DATA:  Abdominal pain EXAM: CT ABDOMEN AND PELVIS WITH CONTRAST TECHNIQUE: Multidetector CT imaging of the abdomen and pelvis was performed using the standard protocol following bolus administration of intravenous contrast.  CONTRAST:  OMNIPAQUE IOHEXOL 350 MG/ML SOLN COMPARISON:  10/05/2018 FINDINGS: Lower chest: 4 mm left lower lobe pulmonary nodule stable dating back to 2017. Focal patchy ground-glass in the left lower lobe on image 13 measures 1.2 cm. Hepatobiliary: Unremarkable Pancreas: Unremarkable Spleen: Unremarkable Adrenals/Urinary Tract: Unremarkable Stomach/Bowel: Normal appendix. No obvious mass in the colon. Small bowel is decompressed. Stomach is unremarkable. Vascular/Lymphatic: No evidence of aortic aneurysm. No abnormal retroperitoneal adenopathy. Reproductive: Normal prostate. Other: No free fluid. Musculoskeletal: L5 pars defects.  No vertebral compression. IMPRESSION: No acute intra-abdominal pathology. 1.2 cm focus of ground-glass in the left lower lobe. An inflammatory process is not excluded. Electronically Signed   By: Jolaine Click M.D.   On: 10/09/2018 14:04  EKG:   Orders placed or performed during the hospital encounter of 10/05/18  . EKG 12-Lead  . EKG 12-Lead  . ED EKG  . ED EKG  . ED EKG  . ED EKG  . EKG    IMPRESSION AND PLAN:   28 year old male with no past medical history who was discharged 4 days ago with intractable nausea and vomiting presents again to the emergency room due to intractable nausea, vomiting and abdominal pain.  1.  Acute abdominal pain of unclear etiology with negative CT scan x2 Start PPI IV twice daily Consultation with GI and case discussed with Dr. Allegra Lai Continue supportive care with pain management  2.  Intractable nausea and vomiting: Continue supportive management Plan after GI evaluation  3.  Hypokalemia due to problem #2: Replace and check magnesium level  4.  Mild hyponatremia due to poor p.o. intake from abdominal pain with nausea and vomiting: Continue IV fluids  5.  Community-acquired pneumonia: Start Levaquin and treat for 5 days.    All the records are reviewed and case discussed with ED provider. Management plans discussed  with the patient and he is in agreement  CODE STATUS: full  TOTAL TIME TAKING CARE OF THIS PATIENT: 44 minutes.    Frazier Balfour M.D on 10/09/2018 at 3:18 PM  Between 7am to 6pm - Pager - [phone]  After 6pm go to [email or web address] - password [email or web address]  [facility]  Office  [phone]  CC: Primary care physician; Patient, No Pcp Per

## 2018-10-09 NOTE — Consult Note (Signed)
Pharmacy Antibiotic Note  James Sanchez is a 28 y.o. male admitted on 10/09/2018 with pneumonia.  Pharmacy has been consulted for Levaquin dosing.  Plan: Levaquin 750mg  IV q 24 hours  Weight: 279 lb 15.8 oz (127 kg)  Temp (24hrs), Avg:98.3 F (36.8 C), Min:98 F (36.7 C), Max:99 F (37.2 C)  Recent Labs  Lab 10/05/18 1124 10/06/18 0423 10/09/18 1100 10/09/18 1256  WBC 11.6*  --  9.2  --   CREATININE 0.84 0.78 0.81  --   LATICACIDVEN  --   --   --  1.3    Estimated Creatinine Clearance: 176.3 mL/min (by C-G formula based on SCr of 0.81 mg/dL).    Allergies  Allergen Reactions  . Aspirin Nausea And Vomiting    As child per mother  . Sulfa Antibiotics Nausea And Vomiting    As child per mother    Antimicrobials this admission: Levaquin 2/29 >>    Dose adjustments this admission: None  Microbiology results: None at this time  Thank you for allowing pharmacy to be a part of this patient's care.  Albina Billet, PharmD, BCPS Clinical Pharmacist 10/09/2018 3:26 PM

## 2018-10-09 NOTE — Consult Note (Signed)
PHARMACY CONSULT NOTE - FOLLOW UP  Pharmacy Consult for Electrolyte Monitoring and Replacement   Recent Labs: Potassium (mmol/L)  Date Value  10/09/2018 3.2 (L)  12/11/2013 3.8   Magnesium (mg/dL)  Date Value  54/62/7035 1.9   Calcium (mg/dL)  Date Value  00/93/8182 9.3   Calcium, Total (mg/dL)  Date Value  99/37/1696 9.5   Albumin (g/dL)  Date Value  78/93/8101 3.9  12/11/2013 4.3   Sodium (mmol/L)  Date Value  10/09/2018 135  12/11/2013 138     Assessment: Pharmacy has been consulted to monitor electrolytes in this patient who is mildly hypokalemic.  Patient received a total of of IV KCl  Goal of Therapy:  Electrolytes wnl's  Plan:  Will recheck patient potassium with am labs  Albina Billet ,PharmD Clinical Pharmacist 10/09/2018 7:34 PM

## 2018-10-09 NOTE — ED Notes (Signed)
Patient transported to CT 

## 2018-10-09 NOTE — ED Notes (Signed)
ED TO INPATIENT HANDOFF REPORT  ED Nurse Name and Phone #: Harvey Lingo [phone]  S Name/Age/Gender James Sanchez 28 y.o. male Room/Bed: ED14A/ED14A  Code Status   Code Status: Full Code  Home/SNF/Other Home Patient oriented to: self, place, time and situation Is this baseline? Yes   Triage Complete: Triage complete  Chief Complaint abd pain  Triage Note No notes on file   Allergies Allergies  Allergen Reactions  . Aspirin Nausea And Vomiting    As child per mother  . Sulfa Antibiotics Nausea And Vomiting    As child per mother    Level of Care/Admitting Diagnosis ED Disposition    ED Disposition Condition Comment   Admit  Hospital Area: [facility] [[id number]]  Level of Care: Med-Surg [16]  Diagnosis: Abdominal pain [270350]  Admitting Physician: Adrian Saran [[id number]]  Attending Physician: MODY, Patricia Pesa [[id number]]  PT Class (Do Not Modify): Observation [104]  PT Acc Code (Do Not Modify): Observation [10022]       B Medical/Surgery History History reviewed. No pertinent past medical history. History reviewed. No pertinent surgical history.   A IV Location/Drains/Wounds Patient Lines/Drains/Airways Status   Active Line/Drains/Airways    Name:   Placement date:   Placement time:   Site:   Days:   Peripheral IV 10/09/18 Right Hand   10/09/18    1050    Hand   less than 1          Intake/Output Last 24 hours No intake or output data in the 24 hours ending 10/09/18 1552  Labs/Imaging Results for orders placed or performed during the hospital encounter of 10/09/18 (from the past 48 hour(s))  Comprehensive metabolic panel     Status: Abnormal   Collection Time: 10/09/18 11:00 AM  Result Value Ref Range   Sodium 135 135 - 145 mmol/L   Potassium 3.2 (L) 3.5 - 5.1 mmol/L   Chloride 102 98 - 111 mmol/L   CO2 21 (L) 22 - 32 mmol/L   Glucose, Bld 100 (H) 70 - 99 mg/dL   BUN 11 6 - 20 mg/dL   Creatinine, Ser 6.96 0.61 - 1.24 mg/dL   Calcium 9.3 8.9  - 78.9 mg/dL   Total Protein 8.6 (H) 6.5 - 8.1 g/dL   Albumin 3.9 3.5 - 5.0 g/dL   AST 31 15 - 41 U/L   ALT 51 (H) 0 - 44 U/L   Alkaline Phosphatase 60 38 - 126 U/L   Total Bilirubin 1.1 0.3 - 1.2 mg/dL   GFR calc non Af Amer >60 >60 mL/min   GFR calc Af Amer >60 >60 mL/min   Anion gap 12 5 - 15    Comment: Performed at [facility], [address] [place], [place] [address]  Lipase, blood     Status: None   Collection Time: 10/09/18 11:00 AM  Result Value Ref Range   Lipase 42 11 - 51 U/L    Comment: Performed at [facility], [address] [place], [place] [address]  CBC with Differential     Status: Abnormal   Collection Time: 10/09/18 11:00 AM  Result Value Ref Range   WBC 9.2 4.0 - 10.5 K/uL   RBC 4.88 4.22 - 5.81 MIL/uL   Hemoglobin 14.0 13.0 - 17.0 g/dL   HCT 58.5 27.7 - 82.4 %   MCV 83.6 80.0 - 100.0 fL   MCH 28.7 26.0 - 34.0 pg   MCHC 34.3 30.0 - 36.0 g/dL   RDW  12.8 11.5 - 15.5 %   Platelets 452 (H) 150 - 400 K/uL   nRBC 0.0 0.0 - 0.2 %   Neutrophils Relative % 69 %   Neutro Abs 6.4 1.7 - 7.7 K/uL   Lymphocytes Relative 22 %   Lymphs Abs 2.0 0.7 - 4.0 K/uL   Monocytes Relative 7 %   Monocytes Absolute 0.6 0.1 - 1.0 K/uL   Eosinophils Relative 1 %   Eosinophils Absolute 0.1 0.0 - 0.5 K/uL   Basophils Relative 0 %   Basophils Absolute 0.0 0.0 - 0.1 K/uL   Immature Granulocytes 1 %   Abs Immature Granulocytes 0.06 0.00 - 0.07 K/uL    Comment: Performed at [facility], [address] [place], [place] [address]  Sedimentation rate     Status: Abnormal   Collection Time: 10/09/18 11:00 AM  Result Value Ref Range   Sed Rate 52 (H) 0 - 15 mm/hr    Comment: Performed at [facility], [address], [place], [place] [address]  Lactic acid, plasma     Status: None   Collection Time: 10/09/18 12:56 PM  Result Value Ref Range   Lactic Acid, Venous 1.3 0.5 - 1.9 mmol/L    Comment: Performed at [facility], [address] [place], [place] [address]   Ct Abdomen Pelvis W Contrast  Result Date: 10/09/2018 CLINICAL DATA:  Abdominal pain EXAM: CT ABDOMEN AND PELVIS WITH CONTRAST TECHNIQUE: Multidetector CT imaging of the abdomen and pelvis was performed using the standard protocol following bolus administration of intravenous contrast. CONTRAST:  OMNIPAQUE IOHEXOL 350 MG/ML SOLN COMPARISON:  10/05/2018 FINDINGS: Lower chest: 4 mm left lower lobe pulmonary nodule stable dating back to 2017. Focal patchy ground-glass in the left lower lobe on image 13 measures 1.2 cm. Hepatobiliary: Unremarkable Pancreas: Unremarkable Spleen: Unremarkable Adrenals/Urinary Tract: Unremarkable Stomach/Bowel: Normal appendix. No obvious mass in the colon. Small bowel is decompressed. Stomach is unremarkable. Vascular/Lymphatic: No evidence of aortic aneurysm. No abnormal retroperitoneal adenopathy. Reproductive: Normal prostate. Other: No free fluid. Musculoskeletal: L5 pars defects.  No vertebral compression. IMPRESSION: No acute intra-abdominal pathology. 1.2 cm focus of ground-glass in the left lower lobe. An inflammatory process is not excluded. Electronically Signed   By: Jolaine Click M.D.   On: 10/09/2018 14:04    Pending Labs Unresulted Labs (From admission, onward)    Start     Ordered   10/16/18 0500  Creatinine, serum  (enoxaparin (LOVENOX)    CrCl >/= 30 ml/min)  Weekly,   STAT    Comments:  while on enoxaparin therapy    10/09/18 1516   10/10/18 0500  Basic metabolic panel  Tomorrow morning,   STAT     10/09/18 1516   10/10/18 0500  CBC  Tomorrow morning,   STAT     10/09/18 1516   10/09/18 1516  Magnesium  Once,   STAT    Comments:  Call MD if Magnesium <1.5    10/09/18 1516   10/09/18 1515  CBC  (enoxaparin (LOVENOX)    CrCl >/= 30 ml/min)  Once,   STAT    Comments:  Baseline for enoxaparin therapy IF NOT ALREADY DRAWN.  Notify MD if PLT < 100 K.    10/09/18 1516   10/09/18 1515  Creatinine, serum   (enoxaparin (LOVENOX)    CrCl >/= 30 ml/min)  Once,   STAT    Comments:  Baseline for enoxaparin therapy IF NOT ALREADY DRAWN.  10/09/18 1516   10/09/18 1511  Urine Drug Screen, Qualitative  Add-on,   AD     10/09/18 1510   10/09/18 1056  Urinalysis, Complete w Microscopic  ONCE - STAT,   STAT     10/09/18 1055          Vitals/Pain Today's Vitals   10/09/18 1200 10/09/18 1330 10/09/18 1437 10/09/18 1439  BP: 135/75 99/60 (!) 130/52 (!) 130/52  Pulse: 91 80 96 (!) 102  Resp: (!) 21 (!) 21  14  Temp:   98 F (36.7 C) 98 F (36.7 C)  TempSrc:    Oral  SpO2: 98% 97% 95% 95%  Weight:      PainSc:    6     Isolation Precautions No active isolations  Medications Medications  enoxaparin (LOVENOX) injection 40 mg (has no administration in time range)  potassium chloride 10 mEq in 100 mL IVPB (has no administration in time range)  0.9 %  sodium chloride infusion (has no administration in time range)  pantoprazole (PROTONIX) injection 40 mg (has no administration in time range)  acetaminophen (TYLENOL) tablet 650 mg (has no administration in time range)    Or  acetaminophen (TYLENOL) suppository 650 mg (has no administration in time range)  ondansetron (ZOFRAN) tablet 4 mg (has no administration in time range)    Or  ondansetron (ZOFRAN) injection 4 mg (has no administration in time range)  HYDROcodone-acetaminophen (NORCO/VICODIN) 5-325 MG per tablet 1-2 tablet (has no administration in time range)  polyethylene glycol (MIRALAX / GLYCOLAX) packet 17 g (has no administration in time range)  levofloxacin (LEVAQUIN) IVPB 750 mg (has no administration in time range)  morphine 4 MG/ML injection 4 mg (4 mg Intravenous Given 10/09/18 1111)  ondansetron (ZOFRAN) injection 4 mg (4 mg Intravenous Given 10/09/18 1109)  iopamidol (ISOVUE-300) 61 % injection 30 mL (30 mLs Oral Contrast Given 10/09/18 1142)  promethazine (PHENERGAN) injection 12.5 mg (12.5 mg Intravenous Given 10/09/18 1213)   sodium chloride 0.9 % bolus 1,000 mL (0 mLs Intravenous Stopped 10/09/18 1330)  iohexol (OMNIPAQUE) 350 MG/ML injection 100 mL (100 mLs Intravenous Contrast Given 10/09/18 1339)  HYDROmorphone (DILAUDID) injection 0.5 mg (0.5 mg Intravenous Given 10/09/18 1546)    Mobility walks Low fall risk   Focused Assessments Cardiac Assessment Handoff:    Lab Results  Component Value Date   TROPONINI <0.03 10/05/2018   No results found for: DDIMER Does the Patient currently have chest pain? No  , Neuro Assessment Handoff:  Alert and oriented       Neuro Assessment:              R Recommendations: See Admitting Provider Note  Report given to:   Additional Notes:  Generalized abdominal pain, small PNA on CT. Pt did not feel comfortable going home.

## 2018-10-09 NOTE — Consult Note (Signed)
Cephas Darby, MD [address]  [place]  [place], [place] [address]  Main: [phone]  Fax: [phone] Pager: [phone]   Consultation  Referring Provider:     No ref. provider found Primary Care Physician:  Patient, No Pcp Per Primary Gastroenterologist: None         Reason for Consultation:     Abdominal pain, nausea and vomiting  Date of Admission:  10/09/2018 Date of Consultation:  10/09/2018         HPI:   James Sanchez is a 28 y.o. male history of marijuana use, who was recently discharged from [facility] 2 days ago after being treated for strep throat as well as intractable nausea and vomiting associated with hypokalemia and hyponatremia.  His symptoms responded to acid suppressive therapy, IV fluids and antiemetics.  He underwent CT abdomen and pelvis which was unremarkable.  There was no evidence of pancreatitis. He presented to ER today with recurrence of symptoms.  He reports upper abdominal pain about the umbilicus as well as intractable nausea and vomiting.  He reports taking hot showers for at least 20 minutes for symptom relief.  Repeat labs including CBC, CMP, lipase, lactic acid came back normal, ESR significantly elevated to 52, reactive thrombocytosis.  Mildly low potassium 3.2.  Patient reports that he has been smoking only small amounts of marijuana and he does not think he symptoms are related to marijuana use.  He denies alcohol use, urine drug screen pending, HIV negative  He is currently on IV Protonix, IV fluids, Zofran, Phenergan and Levaquin for community-acquired pneumonia  NSAIDs: None  Antiplts/Anticoagulants/Anti thrombotics: None  GI Procedures: None He denies family history of GI malignancy  History reviewed. No pertinent past medical history.  History reviewed. No pertinent surgical history.  Prior to Admission medications   Medication Sig Start Date End Date Taking? Authorizing Provider  ibuprofen (ADVIL,MOTRIN) 600 MG tablet Take  1 tablet (600 mg total) by mouth every 6 (six) hours as needed. 10/01/18  Yes [place], [place], MD  lidocaine (XYLOCAINE) 2 % solution Use as directed 15 mLs in the mouth or throat as needed for mouth pain. 10/01/18  Yes Alfred Levins, [place], MD  ondansetron (ZOFRAN ODT) 4 MG disintegrating tablet Take 1 tablet (4 mg total) by mouth every 8 (eight) hours as needed. 10/01/18  Yes Alfred Levins, [place], MD  HYDROcodone-acetaminophen [facility]) 5-325 MG tablet Take 1 tablet by mouth every 4 (four) hours as needed for moderate pain. 10/09/18   Nena Polio, MD  levofloxacin (LEVAQUIN) 500 MG tablet Take 1 tablet (500 mg total) by mouth daily for 10 days. 10/09/18 10/19/18  Nena Polio, MD    Current Facility-Administered Medications:  .  0.9 %  sodium chloride infusion, , Intravenous, Continuous, Mody, Sital, MD, Last Rate: 125 mL/hr at 10/09/18 1914 .  0.9 %  sodium chloride infusion, , Intravenous, PRN, Bettey Costa, MD, Last Rate: 10 mL/hr at 10/09/18 1914, 250 mL at 10/09/18 1914 .  acetaminophen (TYLENOL) tablet 650 mg, 650 mg, Oral, Q6H PRN **OR** acetaminophen (TYLENOL) suppository 650 mg, 650 mg, Rectal, Q6H PRN, Mody, Sital, MD .  enoxaparin (LOVENOX) injection 40 mg, 40 mg, Subcutaneous, Q24H, Mody, Sital, MD, 40 mg at 10/09/18 2048 .  HYDROcodone-acetaminophen (NORCO/VICODIN) 5-325 MG per tablet 1-2 tablet, 1-2 tablet, Oral, Q4H PRN, Mody, Sital, MD .  levofloxacin (LEVAQUIN) IVPB 750 mg, 750 mg, Intravenous, Q24H, Shanlever, Pierce Crane, RPH, Stopped at 10/09/18 1824 .  morphine 2 MG/ML injection  2 mg, 2 mg, Intravenous, Q4H PRN, Benjie Karvonen, Sital, MD, 2 mg at 10/09/18 1724 .  ondansetron (ZOFRAN) tablet 4 mg, 4 mg, Oral, Q6H PRN **OR** ondansetron (ZOFRAN) injection 4 mg, 4 mg, Intravenous, Q6H PRN, Benjie Karvonen, Sital, MD, 4 mg at 10/09/18 2048 .  pantoprazole (PROTONIX) injection 40 mg, 40 mg, Intravenous, Q12H, Mody, Sital, MD, 40 mg at 10/09/18 2047 .  polyethylene glycol (MIRALAX / GLYCOLAX) packet 17 g, 17  g, Oral, Daily PRN, Bettey Costa, MD  Family History  Problem Relation Age of Onset  . Asthma Mother   . Diabetes Maternal Grandmother   . Diabetes Maternal Grandfather   . Diabetes Paternal Grandmother   . Diabetes Paternal Grandfather      Social History   Tobacco Use  . Smoking status: Current Every Day Smoker  . Smokeless tobacco: Never Used  Substance Use Topics  . Alcohol use: No  . Drug use: Yes    Types: Marijuana    Comment: 2 weeks ago.     Allergies as of 10/09/2018 - Review Complete 10/09/2018  Allergen Reaction Noted  . Aspirin Nausea And Vomiting 05/08/2013  . Sulfa antibiotics Nausea And Vomiting 05/08/2013    Review of Systems:    All systems reviewed and negative except where noted in HPI.   Physical Exam:  Vital signs in last 24 hours: Temp:  [98 F (36.7 C)-99 F (37.2 C)] 98.3 F (36.8 C) (02/29 2034) Pulse Rate:  [80-110] 88 (02/29 2034) Resp:  [12-26] 20 (02/29 2034) BP: (99-158)/(52-100) 158/90 (02/29 2034) SpO2:  [95 %-99 %] 99 % (02/29 2034) Weight:  [127 kg] 127 kg (02/29 1728) Last BM Date: 10/06/18 General:   Pleasant, cooperative in NAD Head:  Normocephalic and atraumatic. Eyes:   No icterus.   Conjunctiva pink. PERRLA. Ears:  Normal auditory acuity. Neck:  Supple; no masses or thyroidomegaly Lungs: Respirations even and unlabored. Lungs clear to auscultation bilaterally.   No wheezes, crackles, or rhonchi.  Heart:  Regular rate and rhythm;  Without murmur, clicks, rubs or gallops Abdomen:  Soft, nondistended, mild tenderness in the upper abdomen. Normal bowel sounds. No appreciable masses or hepatomegaly.  No rebound or guarding.  Rectal:  Not performed. Msk:  Symmetrical without gross deformities.  Strength normal Extremities:  Without edema, cyanosis or clubbing. Neurologic:  Alert and oriented x3;  grossly normal neurologically. Skin:  Intact without significant lesions or rashes. Psych:  Alert and cooperative. Normal  affect.  LAB RESULTS: CBC Latest Ref Rng & Units 10/09/2018 10/05/2018 10/01/2018  WBC 4.0 - 10.5 K/uL 9.2 11.6(H) 15.0(H)  Hemoglobin 13.0 - 17.0 g/dL 14.0 15.9 15.1  Hematocrit 39.0 - 52.0 % 40.8 46.3 43.3  Platelets 150 - 400 K/uL 452(H) 617(H) 446(H)    BMET BMP Latest Ref Rng & Units 10/09/2018 10/06/2018 10/05/2018  Glucose 70 - 99 mg/dL 100(H) 126(H) 108(H)  BUN 6 - 20 mg/dL 11 17 22(H)  Creatinine 0.61 - 1.24 mg/dL 0.81 0.78 0.84  Sodium 135 - 145 mmol/L 135 134(L) 129(L)  Potassium 3.5 - 5.1 mmol/L 3.2(L) 3.2(L) 3.0(L)  Chloride 98 - 111 mmol/L 102 100 92(L)  CO2 22 - 32 mmol/L 21(L) 26 22  Calcium 8.9 - 10.3 mg/dL 9.3 8.2(L) 9.3    LFT Hepatic Function Latest Ref Rng & Units 10/09/2018 10/05/2018 10/01/2018  Total Protein 6.5 - 8.1 g/dL 8.6(H) 9.0(H) 9.8(H)  Albumin 3.5 - 5.0 g/dL 3.9 3.8 4.2  AST 15 - 41 U/L 31 45(H) 106(H)  ALT 0 -  44 U/L 51(H) 71(H) 69(H)  Alk Phosphatase 38 - 126 U/L 60 77 103  Total Bilirubin 0.3 - 1.2 mg/dL 1.1 1.3(H) 2.7(H)     STUDIES: Ct Abdomen Pelvis W Contrast  Result Date: 10/09/2018 CLINICAL DATA:  Abdominal pain EXAM: CT ABDOMEN AND PELVIS WITH CONTRAST TECHNIQUE: Multidetector CT imaging of the abdomen and pelvis was performed using the standard protocol following bolus administration of intravenous contrast. CONTRAST:  191m OMNIPAQUE IOHEXOL 350 MG/ML SOLN COMPARISON:  10/05/2018 FINDINGS: Lower chest: 4 mm left lower lobe pulmonary nodule stable dating back to 2017. Focal patchy ground-glass in the left lower lobe on image 13 measures 1.2 cm. Hepatobiliary: Unremarkable Pancreas: Unremarkable Spleen: Unremarkable Adrenals/Urinary Tract: Unremarkable Stomach/Bowel: Normal appendix. No obvious mass in the colon. Small bowel is decompressed. Stomach is unremarkable. Vascular/Lymphatic: No evidence of aortic aneurysm. No abnormal retroperitoneal adenopathy. Reproductive: Normal prostate. Other: No free fluid. Musculoskeletal: L5 pars defects.  No  vertebral compression. IMPRESSION: No acute intra-abdominal pathology. 1.2 cm focus of ground-glass in the left lower lobe. An inflammatory process is not excluded. Electronically Signed   By: AMarybelle KillingsM.D.   On: 10/09/2018 14:04      Impression / Plan:   TFarid Grigorianis a 28y.o. African-American male with marijuana use presents with 1 week history of intractable nausea and vomiting, upper abdominal pain, recent strep throat infection, currently treated for community-acquired pneumonia.  Differentials include cyclical vomiting syndrome or nonulcer dyspepsia or H. pylori gastritis or peptic ulcer disease or gastroparesis  Continue current antiemetics IV fluids Clear liquid diet EGD with gastric biopsies N.p.o. past midnight Urine drug screen pending  Thank you for involving me in the care of this patient.  Will follow along with you    LOS: 0 days   RSherri Sear MD  10/09/2018, 9:26 PM   Note: This dictation was prepared with Dragon dictation along with smaller phrase technology. Any transcriptional errors that result from this process are unintentional.

## 2018-10-09 NOTE — ED Notes (Signed)
RN at bedside to discharge patient. Pt feels he needs to be admitted because cannot keep anything down.  Dr Darnelle Catalan notified and at bedside. Pt given drink for PO challenge.

## 2018-10-10 ENCOUNTER — Observation Stay: Payer: Self-pay | Admitting: Anesthesiology

## 2018-10-10 ENCOUNTER — Observation Stay: Payer: Self-pay

## 2018-10-10 ENCOUNTER — Encounter
Admission: EM | Disposition: A | Discharge status: Home / Self Care | Payer: Self-pay | Source: Home / Self Care | Attending: Emergency Medicine

## 2018-10-10 ENCOUNTER — Encounter: Payer: Self-pay | Admitting: Anesthesiology

## 2018-10-10 HISTORY — PX: ESOPHAGOGASTRODUODENOSCOPY: SHX5428

## 2018-10-10 LAB — URINALYSIS, COMPLETE (UACMP) WITH MICROSCOPIC
Bacteria, UA: NONE SEEN
Bilirubin Urine: NEGATIVE
Glucose, UA: NEGATIVE mg/dL
Ketones, ur: 20 mg/dL — AB
Leukocytes,Ua: NEGATIVE
Nitrite: NEGATIVE
Protein, ur: NEGATIVE mg/dL
Specific Gravity, Urine: 1.017 (ref 1.005–1.030)
pH: 5 (ref 5.0–8.0)

## 2018-10-10 LAB — CBC
HCT: 37.8 % — ABNORMAL LOW (ref 39.0–52.0)
HEMOGLOBIN: 12.3 g/dL — AB (ref 13.0–17.0)
MCH: 28.3 pg (ref 26.0–34.0)
MCHC: 32.5 g/dL (ref 30.0–36.0)
MCV: 87.1 fL (ref 80.0–100.0)
Platelets: 345 10*3/uL (ref 150–400)
RBC: 4.34 MIL/uL (ref 4.22–5.81)
RDW: 13 % (ref 11.5–15.5)
WBC: 5.4 10*3/uL (ref 4.0–10.5)
nRBC: 0 % (ref 0.0–0.2)

## 2018-10-10 LAB — URINE DRUG SCREEN, QUALITATIVE (ARMC ONLY)
AMPHETAMINES, UR SCREEN: NOT DETECTED
Barbiturates, Ur Screen: NOT DETECTED
Benzodiazepine, Ur Scrn: NOT DETECTED
Cannabinoid 50 Ng, Ur ~~LOC~~: POSITIVE — AB
Cocaine Metabolite,Ur ~~LOC~~: NOT DETECTED
MDMA (Ecstasy)Ur Screen: NOT DETECTED
Methadone Scn, Ur: NOT DETECTED
Opiate, Ur Screen: POSITIVE — AB
Phencyclidine (PCP) Ur S: NOT DETECTED
TRICYCLIC, UR SCREEN: NOT DETECTED

## 2018-10-10 LAB — BASIC METABOLIC PANEL
Anion gap: 7 (ref 5–15)
BUN: 7 mg/dL (ref 6–20)
CO2: 23 mmol/L (ref 22–32)
Calcium: 8.4 mg/dL — ABNORMAL LOW (ref 8.9–10.3)
Chloride: 109 mmol/L (ref 98–111)
Creatinine, Ser: 0.88 mg/dL (ref 0.61–1.24)
GFR calc Af Amer: >60 mL/min (ref >60)
GFR calc non Af Amer: >60 mL/min (ref >60)
Glucose, Bld: 92 mg/dL (ref 70–99)
Potassium: 3.5 mmol/L (ref 3.5–5.1)
Sodium: 139 mmol/L (ref 135–145)

## 2018-10-10 LAB — PROCALCITONIN

## 2018-10-10 SURGERY — EGD (ESOPHAGOGASTRODUODENOSCOPY)
Anesthesia: General

## 2018-10-10 MED ORDER — POTASSIUM CHLORIDE CRYS ER 20 MEQ PO TBCR: 20.0000 meq | EXTENDED_RELEASE_TABLET | Freq: Once | ORAL | Status: DC

## 2018-10-10 MED ORDER — LEVOFLOXACIN IN D5W 750 MG/150ML IV SOLN: 750.0000 mg | INTRAVENOUS | Status: DC

## 2018-10-10 MED ORDER — ALUM & MAG HYDROXIDE-SIMETH 200-200-20 MG/5ML PO SUSP
30.0000 mL | Freq: Once | ORAL | Status: DC
  Filled 2018-10-10: qty 30

## 2018-10-10 MED ORDER — AMITRIPTYLINE HCL 50 MG PO TABS
50.0000 mg | ORAL_TABLET | Freq: Every day | ORAL | Status: DC
  Filled 2018-10-10 (×2): qty 1

## 2018-10-10 MED ORDER — LIDOCAINE HCL (CARDIAC) PF 100 MG/5ML IV SOSY
PREFILLED_SYRINGE | INTRAVENOUS | Status: DC | PRN
  Administered 2018-10-10: 50 mg via INTRAVENOUS

## 2018-10-10 MED ORDER — PANTOPRAZOLE SODIUM 40 MG PO TBEC
40.0000 mg | DELAYED_RELEASE_TABLET | Freq: Two times a day (BID) | ORAL | Status: DC
  Administered 2018-10-11: 40 mg via ORAL
  Filled 2018-10-10: qty 1

## 2018-10-10 MED ORDER — LIDOCAINE VISCOUS HCL 2 % MT SOLN
15.0000 mL | Freq: Once | OROMUCOSAL | Status: DC
  Filled 2018-10-10 (×2): qty 15

## 2018-10-10 MED ORDER — LEVOFLOXACIN IN D5W 750 MG/150ML IV SOLN
750.0000 mg | INTRAVENOUS | Status: DC
  Filled 2018-10-10: qty 150

## 2018-10-10 MED ORDER — PROPOFOL 10 MG/ML IV BOLUS
INTRAVENOUS | Status: DC | PRN
  Administered 2018-10-10: 100 mg via INTRAVENOUS

## 2018-10-10 MED ORDER — PROPOFOL 500 MG/50ML IV EMUL
INTRAVENOUS | Status: DC | PRN
  Administered 2018-10-10: 199 ug/kg/min via INTRAVENOUS

## 2018-10-10 MED ORDER — PROMETHAZINE HCL 25 MG/ML IJ SOLN
12.5000 mg | Freq: Four times a day (QID) | INTRAMUSCULAR | Status: DC | PRN
  Administered 2018-10-10: 12.5 mg via INTRAVENOUS
  Filled 2018-10-10: qty 1

## 2018-10-10 MED ORDER — PROPOFOL 10 MG/ML IV BOLUS
INTRAVENOUS | Status: AC
  Filled 2018-10-10: qty 40

## 2018-10-10 MED ORDER — METOCLOPRAMIDE HCL 5 MG/ML IJ SOLN
10.0000 mg | Freq: Three times a day (TID) | INTRAMUSCULAR | Status: DC
  Administered 2018-10-10 – 2018-10-11 (×2): 10 mg via INTRAVENOUS
  Filled 2018-10-10 (×3): qty 2

## 2018-10-10 NOTE — Progress Notes (Signed)
[employer] [facility] [place] [facility]   PATIENT NAME: James Sanchez    MR#:  [id number]  DATE OF BIRTH:  1991/07/18  SUBJECTIVE:   States he is having continued epigastric abdominal pain today. His nausea and vomiting have resolved. No hematochezia or melena.  REVIEW OF SYSTEMS:  Review of Systems  Constitutional: Negative for chills and fever.  HENT: Negative for congestion and sore throat.   Eyes: Negative for blurred vision and double vision.  Respiratory: Negative for cough and shortness of breath.   Cardiovascular: Negative for chest pain and palpitations.  Gastrointestinal: Positive for abdominal pain. Negative for blood in stool, melena, nausea and vomiting.  Genitourinary: Negative for dysuria and urgency.  Musculoskeletal: Negative for back pain and neck pain.  Neurological: Negative for dizziness and headaches.  Psychiatric/Behavioral: Negative for depression. The patient is not nervous/anxious.     DRUG ALLERGIES:   Allergies  Allergen Reactions  . Aspirin Nausea And Vomiting    As child per mother  . Sulfa Antibiotics Nausea And Vomiting    As child per mother   VITALS:  Blood pressure (!) 152/60, pulse 86, temperature 99.6 F (37.6 C), temperature source Oral, resp. rate 14, height 5\' 8"  (1.727 m), weight 127 kg, SpO2 98 %. PHYSICAL EXAMINATION:  Physical Exam  GENERAL:  Laying in the bed with no acute distress. Pleasant and talkative.  HEENT: Head atraumatic, normocephalic. Pupils equal, round, reactive to light and accommodation. No scleral icterus. Extraocular muscles intact. Oropharynx and nasopharynx clear.  NECK:  Supple, no jugular venous distention. No thyroid enlargement. LUNGS: Lungs are clear to auscultation bilaterally. No wheezes, crackles, rhonchi. No use of accessory muscles of respiration.  CARDIOVASCULAR: RRR, S1, S2 normal. No murmurs, rubs, or gallops.  ABDOMEN: Soft, nondistended. Bowel sounds present. +epigastric abdominal  pain, no rebound or guarding. EXTREMITIES: No pedal edema, cyanosis, or clubbing.  NEUROLOGIC: CN 2-12 intact, no focal deficits. 5/5 muscle strength throughout all extremities. Sensation intact throughout. Gait not checked.  PSYCHIATRIC: The patient is alert and oriented x 3.  SKIN: No obvious rash, lesion, or ulcer.  LABORATORY PANEL:  Male CBC Recent Labs  Lab 10/10/18 0612  WBC 5.4  HGB 12.3*  HCT 37.8*  PLT 345   ------------------------------------------------------------------------------------------------------------------ Chemistries  Recent Labs  Lab 10/09/18 1100 10/09/18 1647 10/10/18 0612  NA 135  --  139  K 3.2*  --  3.5  CL 102  --  109  CO2 21*  --  23  GLUCOSE 100*  --  92  BUN 11  --  7  CREATININE 0.81  --  0.88  CALCIUM 9.3  --  8.4*  MG  --  1.9  --   AST 31  --   --   ALT 51*  --   --   ALKPHOS 60  --   --   BILITOT 1.1  --   --    RADIOLOGY:  Dg Chest 1 View  Result Date: 10/10/2018 CLINICAL DATA:  Strep throat.  Nausea and vomiting. EXAM: CHEST  1 VIEW COMPARISON:  10/05/2018; 10/01/2018 FINDINGS: Grossly unchanged cardiac silhouette mediastinal contours. Focal airspace opacities. No pleural effusion or pneumothorax. No evidence of edema. No acute osseous abnormalities. IMPRESSION: No acute cardiopulmonary disease. Specifically, no evidence of pneumonia. Electronically Signed   By: Simonne Come M.D.   On: 10/10/2018 06:16   ASSESSMENT AND PLAN:   28 year old male with no past medical history who was discharged 4 days ago with  intractable nausea and vomiting presents again to the emergency room due to intractable nausea, vomiting and abdominal pain.  1.  Acute abdominal pain of unclear etiology with negative CT scan x2 -Switch PPI from IV to po -GI consulted- EGD today was normal, recommend amitriptyline 50mg  qhs for CVS prophylaxis.  2.  Intractable nausea and vomiting- resolved -IV anti-emetics prn  3. ?CAP- pneumonia seen on CT  abdomen/pelvis. Patient denies shortness of breath and cough. Has not had fevers or a leukocytosis. Procalcitonin negative. Will stop antibiotics.  All the records are reviewed and case discussed with Care Management/Social Worker. Management plans discussed with the patient, family and they are in agreement.  CODE STATUS: Full Code  TOTAL TIME TAKING CARE OF THIS PATIENT: 45 minutes.   More than 50% of the time was spent in counseling/coordination of care: YES  POSSIBLE D/C tomorrow, DEPENDING ON CLINICAL CONDITION.   Jinny Blossom Mayo M.D on 10/10/2018 at 4:06 PM  Between 7am to 6pm - Pager [phone]  After 6pm go to [email or web address] - Social research officer, government  [facility]  Office  [phone]  CC: Primary care physician; Patient, No Pcp Per  Note: This dictation was prepared with Dragon dictation along with smaller phrase technology. Any transcriptional errors that result from this process are unintentional.

## 2018-10-10 NOTE — Progress Notes (Signed)
15 minute call to floor. 

## 2018-10-10 NOTE — Consult Note (Signed)
Pharmacy Antibiotic Note  James Sanchez is a 28 y.o. male admitted on 10/09/2018 withcommunity acquired pneumonia.  Pharmacy has been consulted for levofloxacin dosing.  Plan: Continue levofloxacin 750mg  IV Q24hr. Patient has no known allergies, please consider transitioning patient to azithromycin/ceftriaxone for duration of CAP treatment.    Height: 5\' 8"  (172.7 cm) Weight: 279 lb 15.8 oz (127 kg) IBW/kg (Calculated) : 68.4  Temp (24hrs), Avg:98.5 F (36.9 C), Min:98 F (36.7 C), Max:99.6 F (37.6 C)  Recent Labs  Lab 10/05/18 1124 10/06/18 0423 10/09/18 1100 10/09/18 1256 10/10/18 0612  WBC 11.6*  --  9.2  --  5.4  CREATININE 0.84 0.78 0.81  --  0.88  LATICACIDVEN  --   --   --  1.3  --     Estimated Creatinine Clearance: 162.3 mL/min (by C-G formula based on SCr of 0.88 mg/dL).    Allergies  Allergen Reactions  . Aspirin Nausea And Vomiting    As child per mother  . Sulfa Antibiotics Nausea And Vomiting    As child per mother    Antimicrobials this admission: Levaquin 2/29 >>    Dose adjustments this admission: None  Microbiology results: None at this time  Thank you for allowing pharmacy to be a part of this patient's care.  Verdis Koval L 10/10/2018 3:55 PM

## 2018-10-10 NOTE — Transfer of Care (Signed)
Immediate Anesthesia Transfer of Care Note  Patient: James Sanchez  Procedure(s) Performed: ESOPHAGOGASTRODUODENOSCOPY (EGD) (N/A )  Patient Location: PACU  Anesthesia Type:General  Level of Consciousness: awake, alert  and oriented  Airway & Oxygen Therapy: Patient Spontanous Breathing and Patient connected to nasal cannula oxygen  Post-op Assessment: Report given to RN and Post -op Vital signs reviewed and stable  Post vital signs: Reviewed and stable  Last Vitals:  Vitals Value Taken Time  BP    Temp    Pulse    Resp    SpO2      Last Pain:  Vitals:   10/10/18 0529  TempSrc: Oral  PainSc:       Patients Stated Pain Goal: 3 (10/09/18 1555)  Complications: No apparent anesthesia complications

## 2018-10-10 NOTE — Anesthesia Postprocedure Evaluation (Signed)
Anesthesia Post Note  Patient: Merril Alfrey  Procedure(s) Performed: ESOPHAGOGASTRODUODENOSCOPY (EGD) (N/A )  Patient location during evaluation: PACU Anesthesia Type: General Level of consciousness: awake and alert Pain management: pain level controlled Vital Signs Assessment: post-procedure vital signs reviewed and stable Respiratory status: spontaneous breathing, nonlabored ventilation, respiratory function stable and patient connected to nasal cannula oxygen Cardiovascular status: blood pressure returned to baseline and stable Postop Assessment: no apparent nausea or vomiting Anesthetic complications: no     Last Vitals:  Vitals:   10/10/18 1303 10/10/18 1411  BP: (!) 107/52 (!) 152/60  Pulse: 94 86  Resp: 13 14  Temp:  37.6 C  SpO2: 99% 98%    Last Pain:  Vitals:   10/10/18 1411  TempSrc: Oral  PainSc:                  Cleda Mccreedy Taneeka Curtner

## 2018-10-10 NOTE — Anesthesia Post-op Follow-up Note (Signed)
Anesthesia QCDR form completed.        

## 2018-10-10 NOTE — Progress Notes (Signed)
I secure chat  With Dr. Allegra Lai about patient's odd behavior, complaints of 10/10 pain , nausea and vomiting. New order for phenergan, reglan ordered. Anselm Jungling

## 2018-10-10 NOTE — Anesthesia Preprocedure Evaluation (Signed)
Anesthesia Evaluation  Patient identified by MRN, date of birth, ID band Patient awake    Reviewed: Allergy & Precautions, H&P , NPO status , Patient's Chart, lab work & pertinent test results  History of Anesthesia Complications Negative for: history of anesthetic complications  Airway Mallampati: III  TM Distance: >3 FB Neck ROM: full    Dental  (+) Chipped, Poor Dentition   Pulmonary neg shortness of breath, Current Smoker,           Cardiovascular Exercise Tolerance: Good (-) angina(-) Past MI and (-) DOE negative cardio ROS       Neuro/Psych negative neurological ROS  negative psych ROS   GI/Hepatic negative GI ROS, Neg liver ROS, neg GERD  ,  Endo/Other  negative endocrine ROS  Renal/GU negative Renal ROS  negative genitourinary   Musculoskeletal   Abdominal   Peds  Hematology negative hematology ROS (+)   Anesthesia Other Findings History reviewed. No pertinent past medical history.  History reviewed. No pertinent surgical history.  BMI    Body Mass Index:  42.57 kg/m      Reproductive/Obstetrics negative OB ROS                             Anesthesia Physical Anesthesia Plan  ASA: III  Anesthesia Plan: General   Post-op Pain Management:    Induction: Intravenous  PONV Risk Score and Plan: Propofol infusion and TIVA  Airway Management Planned: Natural Airway and Nasal Cannula  Additional Equipment:   Intra-op Plan:   Post-operative Plan:   Informed Consent: I have reviewed the patients History and Physical, chart, labs and discussed the procedure including the risks, benefits and alternatives for the proposed anesthesia with the patient or authorized representative who has indicated his/her understanding and acceptance.     Dental Advisory Given  Plan Discussed with: Anesthesiologist, CRNA and Surgeon  Anesthesia Plan Comments: (Patient consented for  risks of anesthesia including but not limited to:  - adverse reactions to medications - risk of intubation if required - damage to teeth, lips or other oral mucosa - sore throat or hoarseness - Damage to heart, brain, lungs or loss of life  Patient voiced understanding.)        Anesthesia Quick Evaluation

## 2018-10-10 NOTE — Progress Notes (Addendum)
Pharmacy Electrolyte Monitoring Consult:  Pharmacy consulted to assist in monitoring and replacing electrolytes in this 28 y.o. male admitted on 10/09/2018 with Abdominal Pain   Labs:  Sodium (mmol/L)  Date Value  10/10/2018 139  12/11/2013 138   Potassium (mmol/L)  Date Value  10/10/2018 3.5  12/11/2013 3.8   Magnesium (mg/dL)  Date Value  16/05/9603 1.9   Calcium (mg/dL)  Date Value  54/04/8118 8.4 (L)   Calcium, Total (mg/dL)  Date Value  14/78/2956 9.5   Albumin (g/dL)  Date Value  21/30/8657 3.9  12/11/2013 4.3    Assessment/Plan: Patient received total or Potassium 40 mEq IV on 2/29. Will order potassium PO x 1.   Patient is ordered NS @ 147mL/hr.   Will check BMP with am labs.   Pharmacy will continue to monitor and adjust per consult.   Simpson,Michael L 10/10/2018 3:51 PM

## 2018-10-10 NOTE — Op Note (Signed)
[facility] ([facility]) Gastroenterology Patient Name: James Sanchez Procedure Date: 10/10/2018 12:20 PM MRN: [id number] Account #: [id number] Date of Birth: [birth year] Admit Type: Outpatient Age: 28 Room: [facility] ENDO ROOM 4 Gender: Male Note Status: Finalized Procedure:            Upper GI endoscopy Indications:          Epigastric abdominal pain, Nausea with vomiting,                        Persistent vomiting Providers:            Toney Reil MD, MD Medicines:            General Anesthesia Complications:        No immediate complications. Estimated blood loss: None. Procedure:            Pre-Anesthesia Assessment:                       - Prior to the procedure, a History and Physical was                        performed, and patient medications and allergies were                        reviewed. The patient is competent. The risks and                        benefits of the procedure and the sedation options and                        risks were discussed with the patient. All questions                        were answered and informed consent was obtained.                        Patient identification and proposed procedure were                        verified by the physician, the nurse, the                        anesthesiologist, the anesthetist and the technician in                        the pre-procedure area in the procedure room in the                        endoscopy suite. Mental Status Examination: alert and                        oriented. Airway Examination: normal oropharyngeal                        airway and neck mobility. Respiratory Examination:                        clear to auscultation. CV Examination: normal.  Prophylactic Antibiotics: The patient does not require                        prophylactic antibiotics. Prior Anticoagulants: The                        patient has taken no previous anticoagulant or       antiplatelet agents. ASA Grade Assessment: II - A                        patient with mild systemic disease. After reviewing the                        risks and benefits, the patient was deemed in                        satisfactory condition to undergo the procedure. The                        anesthesia plan was to use general anesthesia.                        Immediately prior to administration of medications, the                        patient was re-assessed for adequacy to receive                        sedatives. The heart rate, respiratory rate, oxygen                        saturations, blood pressure, adequacy of pulmonary                        ventilation, and response to care were monitored                        throughout the procedure. The physical status of the                        patient was re-assessed after the procedure.                       After obtaining informed consent, the endoscope was                        passed under direct vision. Throughout the procedure,                        the patient's blood pressure, pulse, and oxygen                        saturations were monitored continuously. The Endoscope                        was introduced through the mouth, and advanced to the                        second part of duodenum. The upper GI endoscopy was  accomplished without difficulty. The patient tolerated                        the procedure poorly due to the patient's agitation. Findings:      The duodenal bulb and second portion of the duodenum were normal.      The entire examined stomach was normal. Biopsies were taken with a cold       forceps for Helicobacter pylori testing.      The gastroesophageal junction and examined esophagus were normal. Impression:           - Normal duodenal bulb and second portion of the                        duodenum.                       - Normal stomach. Biopsied.                        - Normal gastroesophageal junction and esophagus. Recommendation:       - Return patient to hospital ward for ongoing care.                       - Advance diet as tolerated today.                       - Continue present medications.                       - Await pathology results. Dr. Libby Maw Toney Reil MD, MD 10/10/2018 12:41:26 PM This report has been signed electronically. Number of Addenda: 0 Note Initiated On: 10/10/2018 12:20 PM      [facility]

## 2018-10-11 ENCOUNTER — Encounter: Payer: Self-pay | Admitting: Gastroenterology

## 2018-10-11 LAB — BASIC METABOLIC PANEL
Anion gap: 10 (ref 5–15)
BUN: 6 mg/dL (ref 6–20)
CO2: 22 mmol/L (ref 22–32)
Calcium: 8.8 mg/dL — ABNORMAL LOW (ref 8.9–10.3)
Chloride: 105 mmol/L (ref 98–111)
Creatinine, Ser: 0.72 mg/dL (ref 0.61–1.24)
GFR calc Af Amer: >60 mL/min (ref >60)
GFR calc non Af Amer: >60 mL/min (ref >60)
Glucose, Bld: 90 mg/dL (ref 70–99)
Potassium: 3.2 mmol/L — ABNORMAL LOW (ref 3.5–5.1)
Sodium: 137 mmol/L (ref 135–145)

## 2018-10-11 MED ORDER — AMITRIPTYLINE HCL 50 MG PO TABS: 50.0000 mg | ORAL_TABLET | Freq: Every day | ORAL | 0 refills | Status: DC

## 2018-10-11 MED ORDER — PROMETHAZINE HCL 25 MG PO TABS: 25.0000 mg | ORAL_TABLET | Freq: Four times a day (QID) | ORAL | Status: DC | PRN

## 2018-10-11 MED ORDER — POTASSIUM CHLORIDE CRYS ER 20 MEQ PO TBCR
40.0000 meq | EXTENDED_RELEASE_TABLET | Freq: Once | ORAL | Status: DC
  Filled 2018-10-11: qty 2

## 2018-10-11 MED ORDER — PROMETHAZINE HCL 25 MG PO TABS: 25.0000 mg | ORAL_TABLET | Freq: Four times a day (QID) | ORAL | 0 refills | Status: DC | PRN

## 2018-10-11 MED ORDER — PANTOPRAZOLE SODIUM 40 MG PO TBEC: 40.0000 mg | DELAYED_RELEASE_TABLET | Freq: Two times a day (BID) | ORAL | 0 refills | Status: DC

## 2018-10-11 NOTE — Discharge Summary (Signed)
[facility] -  at [facility]   PATIENT NAME: James Sanchez    MR#:  [id number]  DATE OF BIRTH:  March 25, 1991  DATE OF ADMISSION:  10/09/2018   ADMITTING PHYSICIAN: Adrian Saran, MD  DATE OF DISCHARGE: 10/11/18  PRIMARY CARE PHYSICIAN: Patient, No Pcp Per   ADMISSION DIAGNOSIS:  PNA (pneumonia) [J18.9] Abdominal pain, unspecified abdominal location [R10.9] DISCHARGE DIAGNOSIS:  Active Problems:   Abdominal pain  SECONDARY DIAGNOSIS:  History reviewed. No pertinent past medical history. HOSPITAL COURSE:   James Sanchez is a 28 year old male who presented to the ED with nausea, vomiting, and epigastric abdominal pain.  CT abdomen pelvis was negative for any acute abnormality.  It did show a possible pneumonia of the left lower lobe.  Patient was admitted for further management.  Cyclic vomiting syndrome- nausea and vomiting improved -May be related to marijuana use -GI consulted-recommended amitriptyline 50 mg nightly for CVS prophylaxis -Antiemetics prescribed on discharge  Epigastric abdominal pain- likely related to vomiting. -Initially treated with IV PPI and then transitioned to p.o. PPI on discharge -Underwent EGD on 3/1, which was unremarkable  Questionable LLL infiltrate- seen on CT abdomen/pelvis. Patient denied shortness of breath and cough. Did not have fevers or a leukocytosis. Procalcitonin negative.  Given a dose of Levaquin, but this was stopped.  DISCHARGE CONDITIONS:  Cyclic vomiting syndrome Epigastric abdominal pain CONSULTS OBTAINED:  Treatment Team:  Toney Reil, MD DRUG ALLERGIES:   Allergies  Allergen Reactions  . Aspirin Nausea And Vomiting    As child per mother  . Sulfa Antibiotics Nausea And Vomiting    As child per mother   DISCHARGE MEDICATIONS:   Allergies as of 10/11/2018      Reactions   Aspirin Nausea And Vomiting   As child per mother   Sulfa Antibiotics Nausea And Vomiting   As child per mother        Medication List    STOP taking these medications   ibuprofen 600 MG tablet Commonly known as:  ADVIL,MOTRIN   lidocaine 2 % solution Commonly known as:  XYLOCAINE     TAKE these medications   amitriptyline 50 MG tablet Commonly known as:  ELAVIL Take 1 tablet (50 mg total) by mouth at bedtime.   HYDROcodone-acetaminophen 5-325 MG tablet Commonly known as:  NORCO Take 1 tablet by mouth every 4 (four) hours as needed for moderate pain.   ondansetron 4 MG disintegrating tablet Commonly known as:  ZOFRAN ODT Take 1 tablet (4 mg total) by mouth every 8 (eight) hours as needed.   pantoprazole 40 MG tablet Commonly known as:  PROTONIX Take 1 tablet (40 mg total) by mouth 2 (two) times daily before a meal.   promethazine 25 MG tablet Commonly known as:  PHENERGAN Take 1 tablet (25 mg total) by mouth every 6 (six) hours as needed for nausea, vomiting or refractory nausea / vomiting.        DISCHARGE INSTRUCTIONS:  1.  Follow-up with PCP in 5 days 2.  Follow-up with gastroenterology in 2 weeks DIET:  Regular diet DISCHARGE CONDITION:  Stable ACTIVITY:  Activity as tolerated OXYGEN:  Home Oxygen: No.  Oxygen Delivery: room air DISCHARGE LOCATION:  home   If you experience worsening of your admission symptoms, develop shortness of breath, life threatening emergency, suicidal or homicidal thoughts you must seek medical attention immediately by calling 911 or calling your MD immediately  if symptoms less severe.  You Must read complete instructions/literature  along with all the possible adverse reactions/side effects for all the Medicines you take and that have been prescribed to you. Take any new Medicines after you have completely understood and accpet all the possible adverse reactions/side effects.   Please note  You were cared for by a hospitalist during your hospital stay. If you have any questions about your discharge medications or the care you received while you  were in the hospital after you are discharged, you can call the unit and asked to speak with the hospitalist on call if the hospitalist that took care of you is not available. Once you are discharged, your primary care physician will handle any further medical issues. Please note that NO REFILLS for any discharge medications will be authorized once you are discharged, as it is imperative that you return to your primary care physician (or establish a relationship with a primary care physician if you do not have one) for your aftercare needs so that they can reassess your need for medications and monitor your lab values.    On the day of Discharge:  VITAL SIGNS:  Blood pressure 132/66, pulse 66, temperature 98.4 F (36.9 C), temperature source Oral, resp. rate 20, height 5\' 8"  (1.727 m), weight 127 kg, SpO2 98 %. PHYSICAL EXAMINATION:  GENERAL:  28 y.o.-year-old patient lying in the bed with no acute distress.  EYES: Pupils equal, round, reactive to light and accommodation. No scleral icterus. Extraocular muscles intact.  HEENT: Head atraumatic, normocephalic. Oropharynx and nasopharynx clear.  NECK:  Supple, no jugular venous distention. No thyroid enlargement, no tenderness.  LUNGS: Normal breath sounds bilaterally, no wheezing, rales,rhonchi or crepitation. No use of accessory muscles of respiration.  CARDIOVASCULAR: S1, S2 normal. No murmurs, rubs, or gallops.  ABDOMEN: Soft, non-distended. Bowel sounds present. No organomegaly or mass. + Mild epigastric tenderness to palpation, no rebound or guarding. EXTREMITIES: No pedal edema, cyanosis, or clubbing.  NEUROLOGIC: Cranial nerves II through XII are intact. Muscle strength 5/5 in all extremities. Sensation intact. Gait not checked.  PSYCHIATRIC: The patient is alert and oriented x 3.  SKIN: No obvious rash, lesion, or ulcer.  DATA REVIEW:   CBC Recent Labs  Lab 10/10/18 0612  WBC 5.4  HGB 12.3*  HCT 37.8*  PLT 345    Chemistries    Recent Labs  Lab 10/09/18 1100 10/09/18 1647  10/11/18 0546  NA 135  --    < > 137  K 3.2*  --    < > 3.2*  CL 102  --    < > 105  CO2 21*  --    < > 22  GLUCOSE 100*  --    < > 90  BUN 11  --    < > 6  CREATININE 0.81  --    < > 0.72  CALCIUM 9.3  --    < > 8.8*  MG  --  1.9  --   --   AST 31  --   --   --   ALT 51*  --   --   --   ALKPHOS 60  --   --   --   BILITOT 1.1  --   --   --    < > = values in this interval not displayed.     Microbiology Results  Results for orders placed or performed during the hospital encounter of 10/01/18  Group A Strep by PCR ([facility] Only)     Status: Abnormal  Collection Time: 10/01/18 10:26 AM  Result Value Ref Range Status   Group A Strep by PCR DETECTED (A) NOT DETECTED Final    Comment: Performed at [facility], [address], [place], [place] [address]    RADIOLOGY:  No results found.   Management plans discussed with the patient, family and they are in agreement.  CODE STATUS: Prior   TOTAL TIME TAKING CARE OF THIS PATIENT: 35 minutes.    James Sanchez M.D on 10/11/2018 at 3:41 PM  Between 7am to 6pm - Pager [phone]  After 6pm go to [email or web address] - Social research officer, government  [facility]  Office  [phone]  CC: Primary care physician; Patient, No Pcp Per   Note: This dictation was prepared with Dragon dictation along with smaller phrase technology. Any transcriptional errors that result from this process are unintentional.

## 2018-10-11 NOTE — Care Management Note (Signed)
Case Management Note  Patient Details  Name: Miykael Sarte MRN: [id number] Date of Birth: Aug 31, 1990   Patient to discharge home today.  Provided application to [facility] , Medication Management .  Patient provided with coupons from [email or web address] for prescriptions at discharge.  Elavil $4, protonix $18.78, Phenergan $9.99,  norco no coupons available   Subjective/Objective:                    Action/Plan:   Expected Discharge Date:  10/11/18               Expected Discharge Plan:  Home/Self Care  In-House Referral:     Discharge planning Services  CM Consult, Medication Assistance, Indigent Health Clinic  Post Acute Care Choice:    Choice offered to:     DME Arranged:    DME Agency:     HH Arranged:    HH Agency:     Status of Service:  Completed, signed off  If discussed at [employer], dates discussed:    Additional Comments:  Chapman Fitch, RN 10/11/2018, 11:15 AM

## 2018-10-11 NOTE — Progress Notes (Addendum)
Pharmacy Electrolyte Monitoring Consult:  Pharmacy consulted to assist in monitoring and replacing electrolytes in this 28 y.o. male admitted on 10/09/2018 with Abdominal Pain   Labs:  Sodium (mmol/L)  Date Value  10/11/2018 137  12/11/2013 138   Potassium (mmol/L)  Date Value  10/11/2018 3.2 (L)  12/11/2013 3.8   Magnesium (mg/dL)  Date Value  54/62/7035 1.9   Calcium (mg/dL)  Date Value  00/93/8182 8.8 (L)   Calcium, Total (mg/dL)  Date Value  99/37/1696 9.5   Albumin (g/dL)  Date Value  78/93/8101 3.9  12/11/2013 4.3    Assessment/Plan: Patient's current Potassium level is 3.2 mmol/L. Dr. Nancy Marus has ordered potassium PO x 1.  Magnesium and Sodium levels are WNL. No adjustments necessary at this time.   Will check BMP with am labs.   Pharmacy will continue to monitor and adjust per consult.   Jerline Linzy A Meta Kroenke 10/11/2018 8:06 AM

## 2018-10-11 NOTE — Discharge Instructions (Signed)
It was so nice to meet you during this hospitalization!  You came into the hospital with nausea and vomiting. We did an endoscopy to look at the inside of your stomach. The endoscopy was normal.  I have prescribed the following medications: 1. Please take amitriptyline 50mg  daily at bedtime. This will help prevent cyclic vomiting in the future 2. Protonix 40mg  twice daily- this is a medication to help with abdominal pain and reflux 3. You can use zofran or phenergan every 6 hours as needed for nausea/vomiting  Take care, Dr. Nancy Marus

## 2018-10-13 LAB — SURGICAL PATHOLOGY

## 2018-10-14 ENCOUNTER — Encounter: Payer: Self-pay | Admitting: Gastroenterology

## 2018-10-22 ENCOUNTER — Emergency Department: Payer: Self-pay

## 2018-10-22 ENCOUNTER — Encounter: Payer: Self-pay | Admitting: Intensive Care

## 2018-10-22 ENCOUNTER — Emergency Department
Admission: EM | Admit: 2018-10-22 | Discharge: 2018-10-22 | Disposition: A | Discharge status: Home / Self Care | Payer: Self-pay | Attending: Emergency Medicine | Admitting: Emergency Medicine

## 2018-10-22 ENCOUNTER — Other Ambulatory Visit: Payer: Self-pay

## 2018-10-22 DIAGNOSIS — R110 Nausea: Secondary | ICD-10-CM | POA: Insufficient documentation

## 2018-10-22 DIAGNOSIS — R109 Unspecified abdominal pain: Secondary | ICD-10-CM | POA: Insufficient documentation

## 2018-10-22 DIAGNOSIS — F1210 Cannabis abuse, uncomplicated: Secondary | ICD-10-CM | POA: Insufficient documentation

## 2018-10-22 DIAGNOSIS — Z87891 Personal history of nicotine dependence: Secondary | ICD-10-CM | POA: Insufficient documentation

## 2018-10-22 HISTORY — DX: Cyclical vomiting syndrome unrelated to migraine: R11.15

## 2018-10-22 HISTORY — DX: Streptococcal pharyngitis: J02.0

## 2018-10-22 LAB — CBC
HCT: 41.2 % (ref 39.0–52.0)
HEMOGLOBIN: 13.9 g/dL (ref 13.0–17.0)
MCH: 28.6 pg (ref 26.0–34.0)
MCHC: 33.7 g/dL (ref 30.0–36.0)
MCV: 84.8 fL (ref 80.0–100.0)
Platelets: 349 10*3/uL (ref 150–400)
RBC: 4.86 MIL/uL (ref 4.22–5.81)
RDW: 13.2 % (ref 11.5–15.5)
WBC: 6.7 10*3/uL (ref 4.0–10.5)
nRBC: 0 % (ref 0.0–0.2)

## 2018-10-22 LAB — LIPASE, BLOOD: LIPASE: 30 U/L (ref 11–51)

## 2018-10-22 LAB — COMPREHENSIVE METABOLIC PANEL
ALT: 31 U/L (ref 0–44)
AST: 30 U/L (ref 15–41)
Albumin: 3.9 g/dL (ref 3.5–5.0)
Alkaline Phosphatase: 60 U/L (ref 38–126)
Anion gap: 12 (ref 5–15)
BUN: <5 mg/dL — ABNORMAL LOW (ref 6–20)
CO2: 20 mmol/L — AB (ref 22–32)
Calcium: 9.2 mg/dL (ref 8.9–10.3)
Chloride: 104 mmol/L (ref 98–111)
Creatinine, Ser: 0.8 mg/dL (ref 0.61–1.24)
GFR calc Af Amer: >60 mL/min (ref >60)
GFR calc non Af Amer: >60 mL/min (ref >60)
Glucose, Bld: 114 mg/dL — ABNORMAL HIGH (ref 70–99)
Potassium: 3.1 mmol/L — ABNORMAL LOW (ref 3.5–5.1)
SODIUM: 136 mmol/L (ref 135–145)
Total Bilirubin: 0.8 mg/dL (ref 0.3–1.2)
Total Protein: 8.3 g/dL — ABNORMAL HIGH (ref 6.5–8.1)

## 2018-10-22 MED ORDER — HALOPERIDOL LACTATE 5 MG/ML IJ SOLN
5.0000 mg | Freq: Once | INTRAMUSCULAR | Status: AC
  Administered 2018-10-22: 5 mg via INTRAVENOUS
  Filled 2018-10-22: qty 1

## 2018-10-22 MED ORDER — SODIUM CHLORIDE 0.9 % IV BOLUS
1000.0000 mL | Freq: Once | INTRAVENOUS | Status: AC
  Administered 2018-10-22: 1000 mL via INTRAVENOUS

## 2018-10-22 MED ORDER — ONDANSETRON 4 MG PO TBDP
4.0000 mg | ORAL_TABLET | Freq: Once | ORAL | Status: AC | PRN
  Administered 2018-10-22: 4 mg via ORAL
  Filled 2018-10-22: qty 1

## 2018-10-22 NOTE — ED Notes (Signed)
Patient transported to X-ray 

## 2018-10-22 NOTE — ED Notes (Signed)
Pt refuses urine sample.  

## 2018-10-22 NOTE — Discharge Instructions (Addendum)
Return to the emergency room for any new or worrisome symptoms, he would not like to stay for urinary tract infection assessment which is certainly her choice but limits our work-up.  If you feel worse or change your mind or have other new or worrisome symptoms please return to the ER.  Please avoid recreational drugs including marijuana.

## 2018-10-22 NOTE — ED Triage Notes (Signed)
Patient c/o sharp right flank pain that started last night. Reports N/V/D Also states he just got discharged from [facility] Wednesday. Patients son is also checking in for N/V. Denies burning when urinating. States his urine was "orange" this morning

## 2018-10-22 NOTE — ED Provider Notes (Addendum)
[facility] Emergency Department Provider Note  ____________________________________________   I have reviewed the triage vital signs and the nursing notes. Where available I have reviewed prior notes and, if possible and indicated, outside hospital notes.    HISTORY  Chief Complaint Abdominal Pain    HPI James Sanchez is a 28 y.o. male  History of intractable nausea and vomiting, cyclic vomiting syndrome, some question about prior marijuana use and prior charts, presents today complaining of abdominal pain which is his usual abdominal pain.  Began overnight.  He also has a cough.  He states that the pain is in his entire abdomen.  Nothing makes it better, is worse when he changes position or tries to dry heave.  He has been having some dry heaving but no vomiting.  He has had nausea.  He has no diarrhea.  States he has had normal bowel movements.  This is the same pain he "always gets".  Patient has been unfortunately seen multiple times for this multiple different facilities and I can look back at multiple different CT scans for similar presentations all of which are negative.  Clearly that does not mean that nothing is going on today, but it is of clinical relevance.  In any event, patient has any fever or chills, has diffuse abdominal discomfort and no other complaints aside his cough which has had for couple days   Past Medical History:  Diagnosis Date  . Cyclical vomiting with nausea   . Strep throat     Patient Active Problem List   Diagnosis Date Noted  . Abdominal pain 10/09/2018  . Intractable nausea and vomiting 10/05/2018    Past Surgical History:  Procedure Laterality Date  . ESOPHAGOGASTRODUODENOSCOPY N/A 10/10/2018   Procedure: ESOPHAGOGASTRODUODENOSCOPY (EGD);  Surgeon: Toney Reil, MD;  Location: [facility] ENDOSCOPY;  Service: Gastroenterology;  Laterality: N/A;    Prior to Admission medications   Medication Sig Start Date End Date  Taking? Authorizing Provider  amitriptyline (ELAVIL) 50 MG tablet Take 1 tablet (50 mg total) by mouth at bedtime. 10/11/18   Mayo, Allyn Kenner, MD  HYDROcodone-acetaminophen (NORCO) 5-325 MG tablet Take 1 tablet by mouth every 4 (four) hours as needed for moderate pain. 10/09/18   Arnaldo Natal, MD  ondansetron (ZOFRAN ODT) 4 MG disintegrating tablet Take 1 tablet (4 mg total) by mouth every 8 (eight) hours as needed. 10/01/18   Nita Sickle, MD  pantoprazole (PROTONIX) 40 MG tablet Take 1 tablet (40 mg total) by mouth 2 (two) times daily before a meal. 10/11/18   Mayo, Allyn Kenner, MD  promethazine (PHENERGAN) 25 MG tablet Take 1 tablet (25 mg total) by mouth every 6 (six) hours as needed for nausea, vomiting or refractory nausea / vomiting. 10/11/18   Mayo, Allyn Kenner, MD    Allergies Aspirin and Sulfa antibiotics  Family History  Problem Relation Age of Onset  . Asthma Mother   . Diabetes Maternal Grandmother   . Diabetes Maternal Grandfather   . Diabetes Paternal Grandmother   . Diabetes Paternal Grandfather     Social History Social History   Tobacco Use  . Smoking status: Former Games developer  . Smokeless tobacco: Never Used  Substance Use Topics  . Alcohol use: No  . Drug use: Yes    Types: Marijuana    Comment: 2 weeks ago.     Review of Systems Constitutional: No fever/chills Eyes: No visual changes. ENT: No sore throat. No stiff neck no neck pain Cardiovascular: Denies  chest pain. Respiratory: Denies shortness of breath. + cough Gastrointestinal:   no vomiting.  No diarrhea.  No constipation. Genitourinary: Negative for dysuria. Musculoskeletal: Negative lower extremity swelling Skin: Negative for rash. Neurological: Negative for severe headaches, focal weakness or numbness.   ____________________________________________   PHYSICAL EXAM:  VITAL SIGNS: ED Triage Vitals [10/22/18 1036]  Enc Vitals Group     BP 108/76     Pulse Rate (!) 116     Resp 20     Temp  98.8 F (37.1 C)     Temp Source Oral     SpO2 98 %     Weight 279 lb 15.8 oz (127 kg)     Height 5\' 8"  (1.727 m)     Head Circumference      Peak Flow      Pain Score 10     Pain Loc      Pain Edu?      Excl. in GC?     Constitutional: Alert and oriented. Well appearing and in no acute distress. Eyes: Conjunctivae are normal Head: Atraumatic HEENT: No congestion/rhinnorhea. Mucous membranes are moist.  Oropharynx non-erythematous Neck:   Nontender with no meningismus, no masses, no stridor Cardiovascular: Normal rate, regular rhythm. Grossly normal heart sounds.  Good peripheral circulation. Respiratory: Normal respiratory effort.  No retractions. Lungs CTAB. Abdominal: Soft and diffusely tender very distractible, no guarding no rebound,. No distention. No guarding no rebound Back:  There is no focal tenderness or step off.  there is no midline tenderness there are no lesions noted. there is no CVA tenderness Musculoskeletal: No lower extremity tenderness, no upper extremity tenderness. No joint effusions, no DVT signs strong distal pulses no edema Neurologic:  Normal speech and language. No gross focal neurologic deficits are appreciated.  Skin:  Skin is warm, dry and intact. No rash noted. Psychiatric: Mood and affect are normal. Speech and behavior are normal.  ____________________________________________   LABS (all labs ordered are listed, but only abnormal results are displayed)  Labs Reviewed  COMPREHENSIVE METABOLIC PANEL - Abnormal; Notable for the following components:      Result Value   Potassium 3.1 (*)    CO2 20 (*)    Glucose, Bld 114 (*)    BUN <5 (*)    Total Protein 8.3 (*)    All other components within normal limits  LIPASE, BLOOD  CBC  URINALYSIS, COMPLETE (UACMP) WITH MICROSCOPIC  URINE DRUG SCREEN, QUALITATIVE ([facility] ONLY)    Pertinent labs  results that were available during my care of the patient were reviewed by me and considered in my medical  decision making (see chart for details). ____________________________________________  EKG  I personally interpreted any EKGs ordered by me or triage  ____________________________________________  RADIOLOGY  Pertinent labs & imaging results that were available during my care of the patient were reviewed by me and considered in my medical decision making (see chart for details). If possible, patient and/or family made aware of any abnormal findings.  No results found. ____________________________________________    PROCEDURES  Procedure(s) performed: None  Procedures  Critical Care performed: None  ____________________________________________   INITIAL IMPRESSION / ASSESSMENT AND PLAN / ED COURSE  Pertinent labs & imaging results that were available during my care of the patient were reviewed by me and considered in my medical decision making (see chart for details).  Patient here for abdominal pain and nausea as well as a cough.  He is quite well-appearing his abdomen is  nonsurgical and as indicated above in the H&P, patient has had innumerable presentations for similar with his most recent CT scan showing nothing acute and that is been present through multiple CT scans that I can review going back to 2016.  We will try Haldol which usually works excellently for this kind of pathology, I did review his QTC and it is reassuring, we will get a chest x-ray because of his reported cough I do not think a yet another CT scan is indicated at this time.  Blood work is reassuring.  ----------------------------------------- 1:13 PM on 10/22/2018 -----------------------------------------  And slept comfortably has not had any vomiting since he has been here, and he feels 100% better.  I offered a urinalysis and he refuses.  He states he just wants to go home because he feels so much better.  He states he always has this.  Given the patient declines further work-up his abdomen is benign on  serial exams, we will discharge.  Considering the patient's symptoms, medical history, and physical examination today, I have low suspicion for cholecystitis or biliary pathology, pancreatitis, perforation or bowel obstruction, hernia, intra-abdominal abscess, AAA or dissection, volvulus or intussusception, mesenteric ischemia, ischemic gut, pyelonephritis or appendicitis.    ____________________________________________   FINAL CLINICAL IMPRESSION(S) / ED DIAGNOSES  Final diagnoses:  None      This chart was dictated using voice recognition software.  Despite best efforts to proofread,  errors can occur which can change meaning.      Jeanmarie Plant, MD 10/22/18 1202    Jeanmarie Plant, MD 10/22/18 1314

## 2018-10-22 NOTE — ED Notes (Signed)
Patient to Rm 5, Ariel RN aware of room placement.

## 2018-10-22 NOTE — ED Notes (Signed)
Pt to family in another room

## 2018-10-25 ENCOUNTER — Ambulatory Visit: Payer: Self-pay | Admitting: Gastroenterology

## 2019-02-01 ENCOUNTER — Other Ambulatory Visit: Payer: Self-pay

## 2019-02-01 ENCOUNTER — Emergency Department (HOSPITAL_COMMUNITY)
Admission: EM | Admit: 2019-02-01 | Discharge: 2019-02-01 | Disposition: A | Discharge status: Home / Self Care | Payer: Self-pay | Attending: Emergency Medicine | Admitting: Emergency Medicine

## 2019-02-01 ENCOUNTER — Encounter (HOSPITAL_COMMUNITY): Payer: Self-pay

## 2019-02-01 DIAGNOSIS — Z79899 Other long term (current) drug therapy: Secondary | ICD-10-CM | POA: Insufficient documentation

## 2019-02-01 DIAGNOSIS — Z87891 Personal history of nicotine dependence: Secondary | ICD-10-CM | POA: Insufficient documentation

## 2019-02-01 DIAGNOSIS — H1032 Unspecified acute conjunctivitis, left eye: Secondary | ICD-10-CM | POA: Insufficient documentation

## 2019-02-01 DIAGNOSIS — R030 Elevated blood-pressure reading, without diagnosis of hypertension: Secondary | ICD-10-CM | POA: Insufficient documentation

## 2019-02-01 MED ORDER — STERILE WATER FOR INJECTION IJ SOLN
INTRAMUSCULAR | Status: AC
  Administered 2019-02-01: 10 mL
  Filled 2019-02-01: qty 10

## 2019-02-01 MED ORDER — AZITHROMYCIN 1 G PO PACK
1.0000 g | PACK | Freq: Once | ORAL | Status: AC
  Administered 2019-02-01: 1 g via ORAL
  Filled 2019-02-01: qty 1

## 2019-02-01 MED ORDER — CIPROFLOXACIN HCL 0.3 % OP OINT: TOPICAL_OINTMENT | OPHTHALMIC | 0 refills | Status: DC

## 2019-02-01 MED ORDER — CEFTRIAXONE SODIUM 1 G IJ SOLR
1.0000 g | Freq: Once | INTRAMUSCULAR | Status: AC
  Administered 2019-02-01: 1 g via INTRAMUSCULAR
  Filled 2019-02-01: qty 10

## 2019-02-01 MED ORDER — FLUORESCEIN SODIUM 1 MG OP STRP
1.0000 | ORAL_STRIP | Freq: Once | OPHTHALMIC | Status: AC
  Administered 2019-02-01: 1 via OPHTHALMIC
  Filled 2019-02-01: qty 1

## 2019-02-01 MED ORDER — CIPROFLOXACIN HCL 0.3 % OP SOLN
2.0000 [drp] | Freq: Once | OPHTHALMIC | Status: AC
  Administered 2019-02-01: 2 [drp] via OPHTHALMIC
  Filled 2019-02-01: qty 2.5

## 2019-02-01 MED ORDER — TETRACAINE HCL 0.5 % OP SOLN
2.0000 [drp] | Freq: Once | OPHTHALMIC | Status: AC
  Administered 2019-02-01: 2 [drp] via OPHTHALMIC
  Filled 2019-02-01: qty 4

## 2019-02-01 NOTE — ED Triage Notes (Addendum)
Patient arrived via POV from home.   Patient states yesterday at work he was cutting onions around 4:00 pm and his left eye started to become irritated.   Patient states he woke up this morning and his left eye is now swollen. Also states puss is coming out of eye.   Denies shob or itchy throat.    A/Ox4 Ambulatory in triage.

## 2019-02-01 NOTE — Discharge Instructions (Addendum)
You have been diagnosed today with acute conjunctivitis of the left eye.  At this time there does not appear to be the presence of an emergent medical condition, however there is always the potential for conditions to change. Please read and follow the below instructions.  Please return to the Emergency Department immediately for any new or worsening symptoms or if your symptoms do not improve within 2 days. Please be sure to follow up with your Primary Care Provider within one week regarding your visit today; please call their office to schedule an appointment even if you are feeling better for a follow-up visit. Go to the eye doctor, Dr. Merry Proud office today at 1 PM he is expecting you there at that time.  His address is [address] arrive at least 15 minutes before your visit. Continue to use the eyedrop antibiotic ciprofloxacin as prescribed unless told otherwise by Dr. Talbert Forest. Additionally your treated empirically today for gonorrhea and chlamydia, please be sure that all your sexual partners are tested and treated prior to re-engaging in sexual activities.  Please use protection with every sexual encounter.  Your eyes swabs for gonorrhea and chlamydia will result in the next 2-3 days, please check your MyChart account for results. Finally your blood pressure was elevated today in the emergency department, please follow-up with your primary care provider within 1 week for blood pressure recheck and medication management.  Please read the hypertension handout today in its entirety.  Get help right away if: You have a fever and your symptoms get worse all of a sudden. You have very bad pain when you move your eye. Your face: Hurts. Is red. Is swollen. You have sudden loss of vision. Any new/concerning or worsening symptoms  Please read the additional information packets attached to your discharge summary.  Do not take your medicine if  develop an itchy rash, swelling in your mouth or  lips, or difficulty breathing; call 911 and seek immediate emergency medical attention if this occurs.

## 2019-02-01 NOTE — ED Notes (Signed)
GC swab from eye

## 2019-02-01 NOTE — ED Notes (Signed)
Bed: WTR6 Expected date:  Expected time:  Means of arrival:  Comments: 

## 2019-02-01 NOTE — ED Provider Notes (Signed)
[facility]-EMERGENCY DEPT Provider Note   CSN: [id number] Arrival date & time: 02/01/19  0831    History   Chief Complaint Chief Complaint  Patient presents with  . Eye Pain    HPI James Sanchez is a 28 y.o. male presented today for left thigh swelling, drainage and redness that began yesterday around 4 PM.  Patient reports that he was chopping onions at work when he had a foreign body sensation to his left eye that felt like an eyelash, he is unable to locate any eyelash in his eye and has had this sensation constantly since onset.  Patient reports that this morning when he woke up his left eye appeared swollen and he had a purulent drainage.  Patient has some erythema to his left lateral conjunctivo-.  He denies any vision changes and reports that he has never had this problem before.  Of note patient reports that he has been performing oral sex with his girlfriend who has been recently diagnosed with "some sort of UTI" this past week for which she has been using antibiotics.  He is unsure if she has been diagnosed with STD.  Denies fever/chills, vision changes, rhinorrhea/congestion, sore throat, photophobia, pain with eye movement, nausea/vomiting or any additional concerns.    HPI  Past Medical History:  Diagnosis Date  . Cyclical vomiting with nausea   . Strep throat     Patient Active Problem List   Diagnosis Date Noted  . Abdominal pain 10/09/2018  . Intractable nausea and vomiting 10/05/2018  . Hypokalemia 06/05/2016  . Intravascular volume depletion 06/05/2016  . Cannabinoid hyperemesis syndrome (HCC) 04/30/2016  . High anion gap metabolic acidosis 04/29/2016  . Chest pain, atypical 12/19/2013  . Cyclical vomiting syndrome 05/08/2013  . Persistent vomiting 04/27/2013  . Anxiety state 09/13/2012  . Psychosis (HCC) 09/13/2012  . Marijuana abuse 08/13/2012  . Cannabis abuse 08/13/2012    Past Surgical History:  Procedure Laterality Date  .  ESOPHAGOGASTRODUODENOSCOPY N/A 10/10/2018   Procedure: ESOPHAGOGASTRODUODENOSCOPY (EGD);  Surgeon: Toney ReilVanga, Rohini Reddy, MD;  [facility] END[facility]COPY;  Service: Gastroenterology;  Laterality: N/A;        Home Medications    Prior to Admission medications   Medication Sig Start Date End Date Taking? Authorizing Provider  amitriptyline (ELAVIL) 50 MG tablet Take 1 tablet (50 mg total) by mouth at bedtime. 10/11/18   Mayo, Allyn KennerKaty Dodd, MD  ciprofloxacin (CILOXAN) 0.3 % ophthalmic ointment Place 2 drops onto your left eye every 2 hours while awake for the next 2 days and then 2 drops every 4 hours while awake for the next 5 days. 02/01/19   Bill SalinasMorelli, Lateisha Thurlow A, PA-C  HYDROcodone-acetaminophen (NORCO) 5-325 MG tablet Take 1 tablet by mouth every 4 (four) hours as needed for moderate pain. Patient not taking: Reported on 10/22/2018 10/09/18   Arnaldo NatalMalinda, Paul F, MD  hydrOXYzine (VISTARIL) 25 MG capsule Take 25 mg by mouth 3 (three) times daily. 10/20/18   [provider]  ondansetron (ZOFRAN ODT) 4 MG disintegrating tablet Take 1 tablet (4 mg total) by mouth every 8 (eight) hours as needed. 10/01/18   Nita SickleVeronese, [place], MD  pantoprazole (PROTONIX) 40 MG tablet Take 1 tablet (40 mg total) by mouth 2 (two) times daily before a meal. 10/11/18   Mayo, Allyn KennerKaty Dodd, MD  Polyethylene Glycol 3350 (PEG 3350) POWD Take 17 g by mouth daily. 10/20/18   [provider]  promethazine (PHENERGAN) 25 MG tablet Take 1 tablet (25 mg total) by  mouth every 6 (six) hours as needed for nausea, vomiting or refractory nausea / vomiting. 10/11/18   Mayo, Allyn KennerKaty Dodd, MD  senna-docusate (SENOKOT-S) 8.6-50 MG tablet Take 2 tablets by mouth Nightly. 10/20/18   [provider]    Family History Family History  Problem Relation Age of Onset  . Asthma Mother   . Diabetes Maternal Grandmother   . Diabetes Maternal Grandfather   . Diabetes Paternal Grandmother   . Diabetes Paternal Grandfather     Social History  Social History   Tobacco Use  . Smoking status: Former Games developermoker  . Smokeless tobacco: Never Used  Substance Use Topics  . Alcohol use: No  . Drug use: Yes    Types: Marijuana    Comment: 2 weeks ago.      Allergies   Aspirin and Sulfa antibiotics   Review of Systems Review of Systems  Constitutional: Negative.  Negative for chills and fever.  HENT: Positive for facial swelling. Negative for congestion, ear pain, sinus pressure, sore throat, trouble swallowing and voice change.   Eyes: Positive for pain (FB sensation), discharge and redness. Negative for photophobia and visual disturbance.  Gastrointestinal: Negative.  Negative for abdominal pain, diarrhea, nausea and vomiting.  Neurological: Negative.  Negative for headaches.   Physical Exam Updated Vital Signs BP (!) 156/96   Pulse 88   Temp 98.4 F (36.9 C) (Oral)   Resp 16   SpO2 100%   Physical Exam Constitutional:      General: He is not in acute distress.    Appearance: Normal appearance. He is well-developed. He is obese. He is not ill-appearing or diaphoretic.  HENT:     Head: Normocephalic and atraumatic.     Right Ear: External ear normal.     Left Ear: External ear normal.     Nose: Nose normal.  Eyes:     General: Vision grossly intact. Gaze aligned appropriately.     Extraocular Movements: Extraocular movements intact.     Pupils: Pupils are equal, round, and reactive to light.     Comments: Left Eye: Lateral conjunctiva erythema. No scleral icterus. Scant purulent discharge. Mild periorbital swelling. PEERL intact. EOMI without nystagmus. No photophobia or consensual photophobia.   Corneal Abrasion Exam Verbal Consent Obtained. Risks, benefits and alternatives explained. 2 drops of tetracaine (PONTOCAINE) 0.5 % ophthalmic solution were applied to the eye. Fluorescein 1 MG ophthalmic strip applied the the surface of the eye Wood's lamp used to screen for abrasion. No increased fluorescein uptake. No  corneal ulcer. Negative Seidel sign. No foreign bodies noted. No visible hyphema. Eye flushed with sterile saline. Patient tolerated the procedure well  TONOPEN: 20 LEFT, 20 RIGHT  No pain with extraocular motion  Neck:     Musculoskeletal: Normal range of motion and neck supple.     Trachea: Trachea and phonation normal. No tracheal deviation.  Pulmonary:     Effort: Pulmonary effort is normal. No respiratory distress.  Abdominal:     General: There is no distension.     Palpations: Abdomen is soft.     Tenderness: There is no abdominal tenderness. There is no guarding or rebound.  Musculoskeletal: Normal range of motion.  Skin:    General: Skin is warm and dry.  Neurological:     Mental Status: He is alert.     GCS: GCS eye subscore is 4. GCS verbal subscore is 5. GCS motor subscore is 6.     Comments: Speech is clear and goal  oriented, follows commands Major Cranial nerves without deficit, no facial droop Moves extremities without ataxia, coordination intact  Psychiatric:        Behavior: Behavior normal.      ED Treatments / Results  Labs (all labs ordered are listed, but only abnormal results are displayed) Labs Reviewed  GC/CHLAMYDIA PROBE AMP ([place]) NOT AT [facility]   EKG None  Radiology No results found.  Procedures Procedures (including critical care time)  Medications Ordered in ED Medications  ciprofloxacin (CILOXAN) 0.3 % ophthalmic solution 2 drop (has no administration in time range)  tetracaine (PONTOCAINE) 0.5 % ophthalmic solution 2 drop (2 drops Left Eye Given 02/01/19 1011)  fluorescein ophthalmic strip 1 strip (1 strip Left Eye Given 02/01/19 1011)  cefTRIAXone (ROCEPHIN) injection 1 g (1 g Intramuscular Given 02/01/19 0950)  azithromycin (ZITHROMAX) powder 1 g (1 g Oral Given 02/01/19 0948)  sterile water (preservative free) injection (10 mLs  Given 02/01/19 0950)     Initial Impression / Assessment and Plan / ED Course  I have reviewed the  triage vital signs and the nursing notes.  Pertinent labs & imaging results that were available during my care of the patient were reviewed by me and considered in my medical decision making (see chart for details).     28 year old male presenting today for 1 day history of foreign body sensation of the left eye and then swelling, erythema and discharge that began this morning.  Purulent discharge present on exams, scant.  No corneal abrasions, entrapment, photophobia or dendritic staining on examination.  Not concerning for iritis or uveitis.  No sign of HSV.  Patient is not a contact lens user.  He denies any visual changes. Does not appear as orbital cellulitis.  Patient states that his girlfriend has recently been diagnosed with a urinary tract infection and that he may have gotten some vaginal fluid in his eye a few times over the past week.  Based on history and examination there is concern for gonococcal or chlamydial conjunctivitis.  Patient has been treated with 1 g Rocephin and 1 g azithromycin per Up-to-date dosing.    Consult called to ophthalmology. Discussed case with Dr. Talbert Forest from ophthalmology who recommends patient be started on Cipro drops at discharge, follow-up in his clinic today at 1 PM.  Patient has been given his first dose of ciprofloxacin drops here in the ED.  He has been given the address to Dr. Merry Proud office and informed to arrive 15 minutes before his appointment.  Patient states understanding and is agreeable to plan of care.  Additionally GC chlamydia probe was collected today and patient states understanding that results will be available on his MyChart in 2-3 days.  Patient informed to have all sexual partners tested and treated.  Advised safe sex practices with patient.  Vision intact, and he appears safe for self transportation to ophthalmologist office after discharge.  Additionally patient informed of elevated blood pressure reading today and follow-up with PCP  within 1 week for blood pressure recheck and medication management, he is asymptomatic regarding his hypertension at this time no further work-up indicated at this time.  At this time there does not appear to be any evidence of an acute emergency medical condition and the patient appears stable for discharge with appropriate outpatient follow up. Diagnosis was discussed with patient who verbalizes understanding of care plan and is agreeable to discharge. I have discussed return precautions with patient who verbalizes understanding of return precautions. Patient to  go to ophthalmologist office at 1pm today. All questions answered.  Patient's case discussed with Dr. Pilar PlateBero who agrees with plan to discharge with follow-up.   Note: Portions of this report may have been transcribed using voice recognition software. Every effort was made to ensure accuracy; however, inadvertent computerized transcription errors may still be present. Final Clinical Impressions(s) / ED Diagnoses   Final diagnoses:  Acute conjunctivitis of left eye, unspecified acute conjunctivitis type    ED Discharge Orders         Ordered    ciprofloxacin (CILOXAN) 0.3 % ophthalmic ointment     02/01/19 1027           Elizabeth PalauMorelli, Shloka Baldridge A, PA-C 02/01/19 1110    Sabas SousBero, Michael M, MD 02/02/19 1559

## 2019-02-04 LAB — GONOCOCCUS CULTURE: Culture: NO GROWTH

## 2019-03-10 ENCOUNTER — Encounter: Payer: Self-pay | Admitting: Physician Assistant

## 2019-03-10 ENCOUNTER — Other Ambulatory Visit: Payer: Self-pay

## 2019-03-10 ENCOUNTER — Ambulatory Visit: Payer: Self-pay | Admitting: Physician Assistant

## 2019-03-10 DIAGNOSIS — N341 Nonspecific urethritis: Secondary | ICD-10-CM

## 2019-03-10 DIAGNOSIS — Z113 Encounter for screening for infections with a predominantly sexual mode of transmission: Secondary | ICD-10-CM

## 2019-03-10 LAB — GRAM STAIN

## 2019-03-10 MED ORDER — AZITHROMYCIN 500 MG PO TABS
1000.0000 mg | ORAL_TABLET | Freq: Once | ORAL | Status: AC
  Administered 2019-03-10: 10:00:00 1000 mg via ORAL

## 2019-03-10 NOTE — Progress Notes (Signed)
STI clinic/screening visit  Subjective:  James Sanchez is a 28 y.o. male being seen today for an STI screening visit. The patient reports they do have symptoms.  Patient has the following medical conditions:   Patient Active Problem List   Diagnosis Date Noted  . Abdominal pain 10/09/2018  . Intractable nausea and vomiting 10/05/2018  . Hypokalemia 06/05/2016  . Intravascular volume depletion 06/05/2016  . Cannabinoid hyperemesis syndrome ([place]) 04/30/2016  . High anion gap metabolic acidosis 73/71/0626  . Chest pain, atypical 12/19/2013  . Cyclical vomiting syndrome 05/08/2013  . Persistent vomiting 04/27/2013  . Anxiety state 09/13/2012  . Psychosis ([place]) 09/13/2012  . Marijuana abuse 08/13/2012  . Cannabis abuse 08/13/2012     Chief Complaint  Patient presents with  . SEXUALLY TRANSMITTED DISEASE    HPI  Patient reports that he has been having a tingling feeling with urination for 1 week.  Denies other symptoms today.  Denies chronic conditions and history of any surgeries.    See flowsheet for further details and programmatic requirements.    The following portions of the patient's history were reviewed and updated as appropriate: allergies, current medications, past medical history, past social history, past surgical history and problem list.  Objective:  There were no vitals filed for this visit.  Physical Exam Constitutional:      General: He is not in acute distress.    Appearance: Normal appearance.  HENT:     Head: Normocephalic and atraumatic.     Mouth/Throat:     Mouth: Mucous membranes are moist.     Pharynx: Oropharynx is clear. No oropharyngeal exudate or posterior oropharyngeal erythema.  Neck:     Musculoskeletal: Neck supple.  Pulmonary:     Effort: Pulmonary effort is normal.  Abdominal:     Palpations: Abdomen is soft. There is no mass.     Tenderness: There is no abdominal tenderness. There is no guarding or rebound.  Genitourinary:  Penis: Normal.      Scrotum/Testes: Normal.     Comments: Pubic area without nits, lice, edema, erythema, lesions and inguinal adenopathy. Penis circumcised and without lesions, discharge at meatus. Lymphadenopathy:     Cervical: No cervical adenopathy.  Skin:    Findings: No bruising, erythema, lesion or rash.     Comments: Multiple tattoos.  Neurological:     Mental Status: He is alert and oriented to person, place, and time.  Psychiatric:        Behavior: Behavior normal.        Thought Content: Thought content normal.        Judgment: Judgment normal.       Assessment and Plan:  James Sanchez is a 28 y.o. male presenting to the [facility] Department for STI screening  1. Screening for STD (sexually transmitted disease) Patient reports symptoms today. Rec condoms with all sex Await test results.  Counseled that RN will call if needs to RTC for any further treatment after results are back.  - Gram stain - HIV [facility] - Syphilis Serology, [place] - Gonococcus culture - Gonococcus culture - azithromycin (ZITHROMAX) tablet 1,000 mg  2. Nongonococcal urethritis Due to symptoms and risks will treat for NGU today. Reviewed with patient that Gram stain is negative but can be affected by multiple factors. Azithromycin 1g po DOT given today. No sex for 7 days and until after partner completes treatment Rec call back and schedule for re-treatment if vomits <2 hr  after taking medicine.  - azithromycin (ZITHROMAX) tablet 1,000 mg     No follow-ups on file.  No future appointments.  Matt Holmesarla J Raenell Mensing, PA

## 2019-03-15 LAB — GONOCOCCUS CULTURE

## 2019-06-24 ENCOUNTER — Other Ambulatory Visit: Payer: Self-pay

## 2019-06-24 ENCOUNTER — Emergency Department
Admission: EM | Admit: 2019-06-24 | Discharge: 2019-06-25 | Disposition: A | Discharge status: Home / Self Care | Payer: Self-pay | Attending: Emergency Medicine | Admitting: Emergency Medicine

## 2019-06-24 ENCOUNTER — Encounter: Payer: Self-pay | Admitting: Emergency Medicine

## 2019-06-24 DIAGNOSIS — Z87891 Personal history of nicotine dependence: Secondary | ICD-10-CM | POA: Insufficient documentation

## 2019-06-24 DIAGNOSIS — F1210 Cannabis abuse, uncomplicated: Secondary | ICD-10-CM | POA: Insufficient documentation

## 2019-06-24 DIAGNOSIS — R1084 Generalized abdominal pain: Secondary | ICD-10-CM | POA: Insufficient documentation

## 2019-06-24 DIAGNOSIS — Z79899 Other long term (current) drug therapy: Secondary | ICD-10-CM | POA: Insufficient documentation

## 2019-06-24 DIAGNOSIS — R1110 Vomiting, unspecified: Secondary | ICD-10-CM | POA: Insufficient documentation

## 2019-06-24 DIAGNOSIS — F12188 Cannabis abuse with other cannabis-induced disorder: Secondary | ICD-10-CM

## 2019-06-24 DIAGNOSIS — Z20828 Contact with and (suspected) exposure to other viral communicable diseases: Secondary | ICD-10-CM | POA: Insufficient documentation

## 2019-06-24 LAB — COMPREHENSIVE METABOLIC PANEL
ALT: 38 U/L (ref 0–44)
AST: 43 U/L — ABNORMAL HIGH (ref 15–41)
Albumin: 4.2 g/dL (ref 3.5–5.0)
Alkaline Phosphatase: 55 U/L (ref 38–126)
Anion gap: 15 (ref 5–15)
BUN: 15 mg/dL (ref 6–20)
CO2: 19 mmol/L — ABNORMAL LOW (ref 22–32)
Calcium: 8.8 mg/dL — ABNORMAL LOW (ref 8.9–10.3)
Chloride: 99 mmol/L (ref 98–111)
Creatinine, Ser: 0.97 mg/dL (ref 0.61–1.24)
GFR calc Af Amer: >60 mL/min (ref >60)
GFR calc non Af Amer: >60 mL/min (ref >60)
Glucose, Bld: 93 mg/dL (ref 70–99)
Potassium: 2.7 mmol/L — CL (ref 3.5–5.1)
Sodium: 133 mmol/L — ABNORMAL LOW (ref 135–145)
Total Bilirubin: 1.7 mg/dL — ABNORMAL HIGH (ref 0.3–1.2)
Total Protein: 8.4 g/dL — ABNORMAL HIGH (ref 6.5–8.1)

## 2019-06-24 LAB — CBC WITH DIFFERENTIAL/PLATELET
Abs Immature Granulocytes: 0.07 10*3/uL (ref 0.00–0.07)
Basophils Absolute: 0 10*3/uL (ref 0.0–0.1)
Basophils Relative: 0 %
Eosinophils Absolute: 0 10*3/uL (ref 0.0–0.5)
Eosinophils Relative: 0 %
HCT: 48 % (ref 39.0–52.0)
Hemoglobin: 17.3 g/dL — ABNORMAL HIGH (ref 13.0–17.0)
Immature Granulocytes: 1 %
Lymphocytes Relative: 20 %
Lymphs Abs: 2.3 10*3/uL (ref 0.7–4.0)
MCH: 29.5 pg (ref 26.0–34.0)
MCHC: 36 g/dL (ref 30.0–36.0)
MCV: 81.8 fL (ref 80.0–100.0)
Monocytes Absolute: 1 10*3/uL (ref 0.1–1.0)
Monocytes Relative: 9 %
Neutro Abs: 8 10*3/uL — ABNORMAL HIGH (ref 1.7–7.7)
Neutrophils Relative %: 70 %
Platelets: UNDETERMINED 10*3/uL (ref 150–400)
RBC: 5.87 MIL/uL — ABNORMAL HIGH (ref 4.22–5.81)
RDW: 12.5 % (ref 11.5–15.5)
Smear Review: UNDETERMINED
WBC: 11.5 10*3/uL — ABNORMAL HIGH (ref 4.0–10.5)
nRBC: 0 % (ref 0.0–0.2)

## 2019-06-24 LAB — TROPONIN I (HIGH SENSITIVITY): Troponin I (High Sensitivity): 7 ng/L (ref <18)

## 2019-06-24 LAB — LIPASE, BLOOD: Lipase: 29 U/L (ref 11–51)

## 2019-06-24 MED ORDER — SODIUM CHLORIDE 0.9 % IV BOLUS
1000.0000 mL | Freq: Once | INTRAVENOUS | Status: AC
  Administered 2019-06-24: 1000 mL via INTRAVENOUS

## 2019-06-24 MED ORDER — FENTANYL CITRATE (PF) 100 MCG/2ML IJ SOLN
100.0000 ug | Freq: Once | INTRAMUSCULAR | Status: AC
  Administered 2019-06-24: 19:00:00 100 ug via INTRAVENOUS
  Filled 2019-06-24: qty 2

## 2019-06-24 MED ORDER — DROPERIDOL 2.5 MG/ML IJ SOLN
2.5000 mg | Freq: Once | INTRAMUSCULAR | Status: AC
  Administered 2019-06-24: 2.5 mg via INTRAVENOUS
  Filled 2019-06-24: qty 2

## 2019-06-24 MED ORDER — GENERIC EXTERNAL MEDICATION
5.00 | Status: DC
Start: ? — End: 2019-06-24

## 2019-06-24 MED ORDER — PROMETHAZINE HCL 25 MG/ML IJ SOLN
25.0000 mg | Freq: Once | INTRAMUSCULAR | Status: AC
  Administered 2019-06-24: 19:00:00 25 mg via INTRAVENOUS
  Filled 2019-06-24: qty 1

## 2019-06-24 MED ORDER — FENTANYL CITRATE (PF) 100 MCG/2ML IJ SOLN
50.0000 ug | INTRAMUSCULAR | Status: DC | PRN
  Administered 2019-06-24: 21:00:00 50 ug via INTRAVENOUS
  Filled 2019-06-24: qty 2

## 2019-06-24 NOTE — ED Triage Notes (Signed)
Pt presents to ED via POV with c/o 10/10 burning, sharp, cramping CP that has been intermittent x 3-4 days. Pt states multiple episodes of emesis with dark brown emesis. Pt states pain is 10/10, appears uncomfortable in triage at this time, states was seen at [facility] and dx with cyclic vomiting syndome, given meds however it is not any better.

## 2019-06-24 NOTE — ED Notes (Signed)
Pt reporting increased pain and unable to sit still in triage.

## 2019-06-24 NOTE — ED Provider Notes (Signed)
[facility] Emergency Department Provider Note   First MD Initiated Contact with Patient 06/24/19 2322     (approximate)  I have reviewed the triage vital signs and the nursing notes.   HISTORY  Chief Complaint Chest Pain and Emesis    HPI James Sanchez is a 28 y.o. male with below list of previous medical conditions including cannabis hyperemesis syndrome presents to the emergency department secondary to generalized abdominal pain and vomiting x4 days.  Patient admits to inability to tolerate eating and drinking.  Patient states that current pain score is 10 out of 10.  Patient states that he was [facility] secondary to the same.        Past Medical History:  Diagnosis Date  . Cyclical vomiting with nausea   . Strep throat     Patient Active Problem List   Diagnosis Date Noted  . Abdominal pain 10/09/2018  . Intractable nausea and vomiting 10/05/2018  . Hypokalemia 06/05/2016  . Intravascular volume depletion 06/05/2016  . Cannabinoid hyperemesis syndrome 04/30/2016  . High anion gap metabolic acidosis 04/29/2016  . Chest pain, atypical 12/19/2013  . Cyclical vomiting syndrome 05/08/2013  . Persistent vomiting 04/27/2013  . Anxiety state 09/13/2012  . Psychosis (H[facility]) 09/13/2012  . Marijuana abuse 08/13/2012  . Cannabis abuse 08/13/2012    Past Surgical History:  Procedure Laterality Date  . ESOPHAGOGASTRODUODENOSCOPY N/A 10/10/2018   Procedure: ESOPHAGOGASTRODUODENOSCOPY (EGD);  Surgeon: Toney ReilVanga, Rohini Reddy, MD;  [facility];  Service: Gastroenterology;  Laterality: N/A;    Prior to Admission medications   Medication Sig Start Date End Date Taking? Authorizing Provider  amitriptyline (ELAVIL) 50 MG tablet Take 1 tablet (50 mg total) by mouth at bedtime. 10/11/18   Mayo, Allyn KennerKaty Dodd, MD  ciprofloxacin (CILOXAN) 0.3 % ophthalmic ointment Place 2 drops onto your left eye every 2 hours while awake for the next 2 days and  then 2 drops every 4 hours while awake for the next 5 days. 02/01/19   Bill SalinasMorelli, Brandon A, PA-C  HYDROcodone-acetaminophen (NORCO) 5-325 MG tablet Take 1 tablet by mouth every 4 (four) hours as needed for moderate pain. Patient not taking: Reported on 10/22/2018 10/09/18   Arnaldo NatalMalinda, Paul F, MD  hydrOXYzine (VISTARIL) 25 MG capsule Take 25 mg by mouth 3 (three) times daily. 10/20/18   [provider]  ondansetron (ZOFRAN ODT) 4 MG disintegrating tablet Take 1 tablet (4 mg total) by mouth every 8 (eight) hours as needed. 10/01/18   Nita SickleVeronese, [place], MD  pantoprazole (PROTONIX) 40 MG tablet Take 1 tablet (40 mg total) by mouth 2 (two) times daily before a meal. 10/11/18   Mayo, Allyn KennerKaty Dodd, MD  Polyethylene Glycol 3350 (PEG 3350) POWD Take 17 g by mouth daily. 10/20/18   [provider]  promethazine (PHENERGAN) 25 MG tablet Take 1 tablet (25 mg total) by mouth every 6 (six) hours as needed for nausea, vomiting or refractory nausea / vomiting. 10/11/18   Mayo, Allyn KennerKaty Dodd, MD  senna-docusate (SENOKOT-S) 8.6-50 MG tablet Take 2 tablets by mouth Nightly. 10/20/18   [provider]    Allergies Aspirin and Sulfa antibiotics  Family History  Problem Relation Age of Onset  . Asthma Mother   . Diabetes Maternal Grandmother   . Diabetes Maternal Grandfather   . Diabetes Paternal Grandmother   . Diabetes Paternal Grandfather     Social History Social History   Tobacco Use  . Smoking status: Former Games developermoker  . Smokeless tobacco:  Never Used  Substance Use Topics  . Alcohol use: Yes    Comment: occasionally  . Drug use: Yes    Types: Marijuana    Comment: 2 weeks ago.     Review of Systems Constitutional: No fever/chills Eyes: No visual changes. ENT: No sore throat. Cardiovascular: Denies chest pain. Respiratory: Denies shortness of breath. Gastrointestinal: Generalized abdominal pain and vomiting. Genitourinary: Negative for dysuria. Musculoskeletal: Negative for neck pain.   Negative for back pain. Integumentary: Negative for rash. Neurological: Negative for headaches, focal weakness or numbness.   ____________________________________________   PHYSICAL EXAM:  VITAL SIGNS: ED Triage Vitals  Enc Vitals Group     BP 06/24/19 1821 (!) 148/100     Pulse Rate 06/24/19 1821 (!) 102     Resp 06/24/19 1821 20     Temp 06/24/19 1821 (!) 100.8 F (38.2 C)     Temp Source 06/24/19 1821 Oral     SpO2 06/24/19 1821 95 %     Weight 06/24/19 1810 127 kg (280 lb)     Height 06/24/19 1810 1.778 m (5\' 10" )     Head Circumference --      Peak Flow --      Pain Score 06/24/19 1810 10     Pain Loc --      Pain Edu? --      Excl. in [place]? --     Constitutional: Alert and oriented. Apparent discomfort Eyes: Conjunctivae are normal.  Mouth/Throat: Patient is wearing a mask. Neck: No stridor.  No meningeal signs.   Cardiovascular: Normal rate, regular rhythm. Good peripheral circulation. Grossly normal heart sounds. Respiratory: Normal respiratory effort.  No retractions. Gastrointestinal: Generalized abdominal pain. No distention.  Musculoskeletal: No lower extremity tenderness nor edema. No gross deformities of extremities. Neurologic:  Normal speech and language. No gross focal neurologic deficits are appreciated.  Skin:  Skin is warm, dry and intact. Psychiatric: Mood and affect are normal. Speech and behavior are normal.  ____________________________________________   LABS (all labs ordered are listed, but only abnormal results are displayed)  Labs Reviewed  CBC WITH DIFFERENTIAL/PLATELET - Abnormal; Notable for the following components:      Result Value   WBC 11.5 (*)    RBC 5.87 (*)    Hemoglobin 17.3 (*)    Neutro Abs 8.0 (*)    All other components within normal limits  COMPREHENSIVE METABOLIC PANEL - Abnormal; Notable for the following components:   Sodium 133 (*)    Potassium 2.7 (*)    CO2 19 (*)    Calcium 8.8 (*)    Total Protein 8.4 (*)     AST 43 (*)    Total Bilirubin 1.7 (*)    All other components within normal limits  SARS CORONAVIRUS 2 (TAT 6-24 HRS)  LIPASE, BLOOD  URINALYSIS, COMPLETE (UACMP) WITH MICROSCOPIC  TROPONIN I (HIGH SENSITIVITY)   ____________________________________________  EKG  ED ECG REPORT I, Martinez N , the attending physician, personally viewed and interpreted this ECG.   Date: 06/24/2019  EKG Time: 6:02 PM  Rate: 111  Rhythm: Sinus tachycardia  Axis: Normal  Intervals: Normal  ST&T Change: None  ____________________________________________  RADIOLOGY I, Gorham N , personally viewed and evaluated these images (plain radiographs) as part of my medical decision making, as well as reviewing the written report by the radiologist.  ED MD interpretation: No acute findings noted on CT abdomen pelvis per radiologist.  Official radiology report(s): Ct Abdomen Pelvis W Contrast  Result  Date: 06/25/2019 CLINICAL DATA:  Acute abdominal pain EXAM: CT ABDOMEN AND PELVIS WITH CONTRAST TECHNIQUE: Multidetector CT imaging of the abdomen and pelvis was performed using the standard protocol following bolus administration of intravenous contrast. CONTRAST:  169mL OMNIPAQUE IOHEXOL 300 MG/ML  SOLN COMPARISON:  10/09/2018 FINDINGS: Lower chest: Lung bases are clear. No effusions. Heart is normal size. Hepatobiliary: No focal hepatic abnormality. Gallbladder unremarkable. Pancreas: No focal abnormality or ductal dilatation. Spleen: No focal abnormality.  Normal size. Adrenals/Urinary Tract: No adrenal abnormality. No focal renal abnormality. No stones or hydronephrosis. Urinary bladder is unremarkable. Stomach/Bowel: Normal appendix. Stomach, large and small bowel grossly unremarkable. Vascular/Lymphatic: No evidence of aneurysm or adenopathy. Reproductive: No visible focal abnormality. Other: No free fluid or free air. Musculoskeletal: No acute bony abnormality. IMPRESSION: No acute findings in the  abdomen or pelvis. Electronically Signed   By: Rolm Baptise M.D.   On: 06/25/2019 00:37     Procedures   ____________________________________________   INITIAL IMPRESSION / MDM / ASSESSMENT AND PLAN / ED COURSE  As part of my medical decision making, I reviewed the following data within the electronic MEDICAL RECORD NUMBER   27 year old male presenting with above-stated history and physical exam secondary to emesis and abdominal discomfort.  CT abdomen and pelvis performed to evaluate for intra-abdominal pathology acute appendicitis diverticulitis or signs of inflammatory bowel disease.  CT revealed no acute intra-abdominal pathology.  Patient's laboratory data notable for hypokalemia of 2.7.  Patient states that he is prescribed potassium for home.  Patient given 40 mEq of potassium p.o. in the emergency department.  Patient was given droperidol 2.5 mg with complete resolution of symptoms.  ____________________________________________  FINAL CLINICAL IMPRESSION(S) / ED DIAGNOSES  Final diagnoses:  Cannabis hyperemesis syndrome concurrent with and due to cannabis abuse [facility])     MEDICATIONS GIVEN DURING THIS VISIT:  Medications  fentaNYL (SUBLIMAZE) injection 50 mcg (50 mcg Intravenous Given 06/24/19 2054)  potassium chloride (KLOR-CON) packet 40 mEq (has no administration in time range)  sodium chloride 0.9 % bolus 1,000 mL (0 mLs Intravenous Stopped 06/24/19 2053)  promethazine (PHENERGAN) injection 25 mg (25 mg Intravenous Given 06/24/19 1833)  fentaNYL (SUBLIMAZE) injection 100 mcg (100 mcg Intravenous Given 06/24/19 1833)  droperidol (INAPSINE) 2.5 MG/ML injection 2.5 mg (2.5 mg Intravenous Given 06/24/19 2351)  sodium chloride 0.9 % bolus 1,000 mL (1,000 mLs Intravenous New Bag/Given 06/24/19 2359)  iohexol (OMNIPAQUE) 300 MG/ML solution 125 mL (125 mLs Intravenous Contrast Given 06/25/19 0015)     ED Discharge Orders    None      *Please note:  James Sanchez was evaluated  in Emergency Department on 06/25/2019 for the symptoms described in the history of present illness. He was evaluated in the context of the global COVID-19 pandemic, which necessitated consideration that the patient might be at risk for infection with the SARS-CoV-2 virus that causes COVID-19. Institutional protocols and algorithms that pertain to the evaluation of patients at risk for COVID-19 are in a state of rapid change based on information released by regulatory bodies including the CDC and federal and state organizations. These policies and algorithms were followed during the patient's care in the ED.  Some ED evaluations and interventions may be delayed as a result of limited staffing during the pandemic.*  Note:  This document was prepared using Dragon voice recognition software and may include unintentional dictation errors.   Gregor Hams, MD 06/25/19 [phone]

## 2019-06-24 NOTE — ED Notes (Signed)
Lab technician requested to redraw green top as she was down here re-drawing for another patient.

## 2019-06-25 ENCOUNTER — Emergency Department: Payer: Self-pay

## 2019-06-25 LAB — SARS CORONAVIRUS 2 (TAT 6-24 HRS): SARS Coronavirus 2: NEGATIVE

## 2019-06-25 MED ORDER — POTASSIUM CHLORIDE 20 MEQ PO PACK
40.0000 meq | PACK | Freq: Two times a day (BID) | ORAL | Status: DC
  Filled 2019-06-25: qty 2

## 2019-06-25 MED ORDER — IOHEXOL 300 MG/ML  SOLN
125.0000 mL | Freq: Once | INTRAMUSCULAR | Status: AC | PRN
  Administered 2019-06-25: 125 mL via INTRAVENOUS

## 2019-09-10 ENCOUNTER — Emergency Department
Admission: EM | Admit: 2019-09-10 | Discharge: 2019-09-10 | Disposition: A | Discharge status: Home / Self Care | Payer: Self-pay | Attending: Emergency Medicine | Admitting: Emergency Medicine

## 2019-09-10 DIAGNOSIS — Z87891 Personal history of nicotine dependence: Secondary | ICD-10-CM | POA: Insufficient documentation

## 2019-09-10 DIAGNOSIS — J020 Streptococcal pharyngitis: Secondary | ICD-10-CM | POA: Insufficient documentation

## 2019-09-10 LAB — GROUP A STREP BY PCR: Group A Strep by PCR: NOT DETECTED

## 2019-09-10 MED ORDER — AMOXICILLIN 500 MG PO CAPS: 500.0000 mg | ORAL_CAPSULE | Freq: Three times a day (TID) | ORAL | 0 refills | Status: DC

## 2019-09-10 MED ORDER — ACETAMINOPHEN 500 MG PO TABS
1000.0000 mg | ORAL_TABLET | Freq: Once | ORAL | Status: AC
  Administered 2019-09-10: 1000 mg via ORAL
  Filled 2019-09-10: qty 2

## 2019-09-10 MED ORDER — CEFTRIAXONE SODIUM 1 G IJ SOLR
1.0000 g | Freq: Once | INTRAMUSCULAR | Status: AC
  Administered 2019-09-10: 1 g via INTRAMUSCULAR
  Filled 2019-09-10: qty 10

## 2019-09-10 MED ORDER — DEXAMETHASONE SODIUM PHOSPHATE 10 MG/ML IJ SOLN
10.0000 mg | Freq: Once | INTRAMUSCULAR | Status: AC
  Administered 2019-09-10: 18:00:00 10 mg via INTRAMUSCULAR
  Filled 2019-09-10: qty 1

## 2019-09-10 NOTE — Discharge Instructions (Addendum)
You were seen today for sore throat and fever. Your rapid stress test is negative. You received intramuscular steroid and antibiotics. You can take Ibuprofen 600-800 mg every 8 hours as needed for pain and inflammation. Salt water gargles may be helpful. I have given you an antibiotic for the next 7 days, please take all as prescribed.

## 2019-09-10 NOTE — ED Triage Notes (Signed)
Ppresents via POV c/o sore throat at fever x2 days. Hasnt taken OTC meds since 0700 this am.

## 2019-09-10 NOTE — ED Provider Notes (Signed)
[facility] Emergency Department Provider Note ____________________________________________  Time seen: 1640  I have reviewed the triage vital signs and the nursing notes.  HISTORY  Chief Complaint  Sore Throat   HPI James Sanchez is a 29 y.o. male presents to the ER with c/o headache, sore throat and fever. This started 2 days ago. The headache is located in the back of his head and radiates to the front of his head. He describes the pain as throbbing. He reports associated pressure in his eyes but denies dizziness or visual changes. He is having difficulty swallowing and reports his voice is "off". He denies runny nose, nasal congestion, ear pain, loss of taste/smell, cough or SOB. His fever has been over 101. He has taken Theraflu with minimal relief. He has not had sick contacts or exposure to Covid that he is aware of.   Past Medical History:  Diagnosis Date  . Cyclical vomiting with nausea   . Strep throat     Patient Active Problem List   Diagnosis Date Noted  . Abdominal pain 10/09/2018  . Intractable nausea and vomiting 10/05/2018  . Hypokalemia 06/05/2016  . Intravascular volume depletion 06/05/2016  . Cannabinoid hyperemesis syndrome 04/30/2016  . High anion gap metabolic acidosis 04/29/2016  . Chest pain, atypical 12/19/2013  . Cyclical vomiting syndrome 05/08/2013  . Persistent vomiting 04/27/2013  . Anxiety state 09/13/2012  . Psychosis (H[facility]) 09/13/2012  . Marijuana abuse 08/13/2012  . Cannabis abuse 08/13/2012    Past Surgical History:  Procedure Laterality Date  . ESOPHAGOGASTRODUODENOSCOPY N/A 10/10/2018   Procedure: ESOPHAGOGASTRODUODENOSCOPY (EGD);  Surgeon: Toney Reil, MD;  Location: [facility] ENDOSCOPY;  Service: Gastroenterology;  Laterality: N/A;    Prior to Admission medications   Medication Sig Start Date End Date Taking? Authorizing Provider  amitriptyline (ELAVIL) 50 MG tablet Take 1 tablet (50 mg total) by mouth at  bedtime. 10/11/18   Mayo, Allyn Kenner, MD  amoxicillin (AMOXIL) 500 MG capsule Take 1 capsule (500 mg total) by mouth 3 (three) times daily. 09/10/19   Lorre Munroe, NP  ciprofloxacin (CILOXAN) 0.3 % ophthalmic ointment Place 2 drops onto your left eye every 2 hours while awake for the next 2 days and then 2 drops every 4 hours while awake for the next 5 days. 02/01/19   Bill Salinas, PA-C  HYDROcodone-acetaminophen (NORCO) 5-325 MG tablet Take 1 tablet by mouth every 4 (four) hours as needed for moderate pain. Patient not taking: Reported on 10/22/2018 10/09/18   Arnaldo Natal, MD  hydrOXYzine (VISTARIL) 25 MG capsule Take 25 mg by mouth 3 (three) times daily. 10/20/18   [provider]  ondansetron (ZOFRAN ODT) 4 MG disintegrating tablet Take 1 tablet (4 mg total) by mouth every 8 (eight) hours as needed. 10/01/18   Nita Sickle, MD  pantoprazole (PROTONIX) 40 MG tablet Take 1 tablet (40 mg total) by mouth 2 (two) times daily before a meal. 10/11/18   Mayo, Allyn Kenner, MD  Polyethylene Glycol 3350 (PEG 3350) POWD Take 17 g by mouth daily. 10/20/18   [provider]  promethazine (PHENERGAN) 25 MG tablet Take 1 tablet (25 mg total) by mouth every 6 (six) hours as needed for nausea, vomiting or refractory nausea / vomiting. 10/11/18   Mayo, Allyn Kenner, MD  senna-docusate (SENOKOT-S) 8.6-50 MG tablet Take 2 tablets by mouth Nightly. 10/20/18   [provider]    Allergies Aspirin and Sulfa antibiotics  Family History  Problem Relation Age of  Onset  . Asthma Mother   . Diabetes Maternal Grandmother   . Diabetes Maternal Grandfather   . Diabetes Paternal Grandmother   . Diabetes Paternal Grandfather     Social History Social History   Tobacco Use  . Smoking status: Former Research scientist (life sciences)  . Smokeless tobacco: Never Used  Substance Use Topics  . Alcohol use: Yes    Comment: occasionally  . Drug use: Yes    Types: Marijuana    Comment: 2 weeks ago.     Review of  Systems  Constitutional: Positive for fever. Negative for chills or body aches. Eyes: Negative for visual changes. ENT: Positive for sore throat. Negative for runny nose, nasal congestion, ear pain or loss of taste/smell. Cardiovascular: Negative for chest pain or chest tightness. Respiratory: Negative for cough or shortness of breath. Skin: Negative for rash. Neurological: Positive for headache. Negative for focal weakness or numbness.  ____________________________________________  PHYSICAL EXAM:  VITAL SIGNS: ED Triage Vitals  Enc Vitals Group     BP 09/10/19 1513 130/71     Pulse Rate 09/10/19 1511 (!) 119     Resp 09/10/19 1511 14     Temp 09/10/19 1511 (!) 102.9 F (39.4 C)     Temp Source 09/10/19 1511 Oral     SpO2 09/10/19 1511 97 %     Weight --      Height --      Head Circumference --      Peak Flow --      Pain Score 09/10/19 1519 8     Pain Loc --      Pain Edu? --      Excl. in [place]? --     Constitutional: Alert and oriented. Ill appearing but in no distress. Head: Normocephalic and atraumatic. Eyes: Conjunctivae are normal. PERRL. Normal extraocular movements Mouth/Throat: Mucous membranes are moist. Tonsils 3+ with white exudate noted bilaterally. Hematological/Lymphatic/Immunological: Bilateral anterior cervical lymphadenopathy. Cardiovascular: Normal rate, regular rhythm. Marland Kitchen Respiratory: Normal respiratory effort. No wheezes/rales/rhonchi. Neurologic:  Normal speech and language. No gross focal neurologic deficits are appreciated. Skin:  Skin is warm, dry and intact. No rash noted.  __________________________________________   LABS  Lab Orders     Group A Strep by PCR   ___________________________________________   INITIAL IMPRESSION / ASSESSMENT AND PLAN / ED COURSE  Acute Headache, Sore Throat and Fever:  RST: negative (will treat based on Centor  Decadron 10 mg IM x 1 Rocephin 1 gm IM x 1 Tylenol 1000 mg PO given in ER  RX for Amoxil  500 mg TID x 7 days ____________________________________________  FINAL CLINICAL IMPRESSION(S) / ED DIAGNOSES  Final diagnoses:  Strep throat   Webb Silversmith, NP    Jearld Fenton, NP 09/10/19 Valerie Roys    Drenda Freeze, MD 09/10/19 [phone]

## 2019-09-10 NOTE — ED Notes (Signed)
Strep swab resent to lab at this time.

## 2019-10-05 ENCOUNTER — Ambulatory Visit: Payer: HRSA Program | Attending: Internal Medicine

## 2019-10-05 DIAGNOSIS — U071 COVID-19: Secondary | ICD-10-CM | POA: Diagnosis not present

## 2019-10-05 DIAGNOSIS — Z20822 Contact with and (suspected) exposure to covid-19: Secondary | ICD-10-CM

## 2019-10-06 LAB — NOVEL CORONAVIRUS, NAA: SARS-CoV-2, NAA: DETECTED — AB

## 2019-10-07 ENCOUNTER — Telehealth: Payer: Self-pay | Admitting: Unknown Physician Specialty

## 2019-10-07 ENCOUNTER — Telehealth: Payer: Self-pay | Admitting: Physician Assistant

## 2019-10-07 NOTE — Telephone Encounter (Signed)
Called to discuss with patient about Covid symptoms and the use of bamlanivimab or casirivimab/imdevimab, a monoclonal antibody infusion for those with mild to moderate Covid symptoms and at a high risk of hospitalization.  Pt is qualified for this infusion at the [facility] infusion center due to BMI>35   I spoke to the patient who reported nasal congestion x2 days (sx onset 2/24). We discussed the mAB but then the phone cut out and I was unable to reach him again. Will try back again later.   Cline Crock PA-C  [facility]

## 2019-10-07 NOTE — Telephone Encounter (Signed)
Called to discuss with patient about Covid symptoms and the use of bamlanivimab, a monoclonal antibody infusion for those with mild to moderate Covid symptoms and at a high risk of hospitalization.  Pt is qualified for this infusion at the [facility] infusion center due to BMI>35   Message left to call back  

## 2019-10-07 NOTE — Telephone Encounter (Signed)
Called to discuss with James Sanchez about Covid symptoms and the use of bamlanivimab, a monoclonal antibody infusion for those with mild to moderate Covid symptoms and at a high risk of hospitalization.     Pt is qualified for this infusion at the [facility] infusion center due to co-morbid conditions and/or a member of an at-risk group, however declines infusion at this time. Symptoms tier reviewed as well as criteria for ending isolation.  Symptoms reviewed that would warrant ED/Hospital evaluation. Preventative practices reviewed. Patient verbalized understanding. Patient advised to call back if he decides that he does want to get infusion. Callback number to the infusion center given. Patient advised to go to Urgent care or ED with severe symptoms.   Patient Active Problem List   Diagnosis Date Noted  . Abdominal pain 10/09/2018  . Intractable nausea and vomiting 10/05/2018  . Hypokalemia 06/05/2016  . Intravascular volume depletion 06/05/2016  . Cannabinoid hyperemesis syndrome 04/30/2016  . High anion gap metabolic acidosis 04/29/2016  . Chest pain, atypical 12/19/2013  . Cyclical vomiting syndrome 05/08/2013  . Persistent vomiting 04/27/2013  . Anxiety state 09/13/2012  . Psychosis (HCC) 09/13/2012  . Marijuana abuse 08/13/2012  . Cannabis abuse 08/13/2012     Gabriel Cirri FNP

## 2020-01-18 ENCOUNTER — Other Ambulatory Visit: Payer: Self-pay

## 2020-01-18 ENCOUNTER — Ambulatory Visit: Payer: Self-pay | Admitting: Physician Assistant

## 2020-01-18 ENCOUNTER — Encounter: Payer: Self-pay | Admitting: Physician Assistant

## 2020-01-18 DIAGNOSIS — Z113 Encounter for screening for infections with a predominantly sexual mode of transmission: Secondary | ICD-10-CM

## 2020-01-18 DIAGNOSIS — Z299 Encounter for prophylactic measures, unspecified: Secondary | ICD-10-CM

## 2020-01-18 DIAGNOSIS — N341 Nonspecific urethritis: Secondary | ICD-10-CM

## 2020-01-18 LAB — GRAM STAIN

## 2020-01-18 MED ORDER — AZITHROMYCIN 500 MG PO TABS
1000.0000 mg | ORAL_TABLET | Freq: Once | ORAL | Status: AC
  Administered 2020-01-18: 1000 mg via ORAL

## 2020-01-18 MED ORDER — ACYCLOVIR 400 MG PO TABS: 400.0000 mg | ORAL_TABLET | Freq: Three times a day (TID) | ORAL | 0 refills | Status: AC

## 2020-01-18 NOTE — Progress Notes (Signed)
[facility] Department STI clinic/screening visit  Subjective:  James Sanchez is a 29 y.o. male being seen today for an STI screening visit. The patient reports they do have symptoms.    Patient has the following medical conditions:   Patient Active Problem List   Diagnosis Date Noted  . Abdominal pain 10/09/2018  . Intractable nausea and vomiting 10/05/2018  . Hypokalemia 06/05/2016  . Intravascular volume depletion 06/05/2016  . Cannabinoid hyperemesis syndrome 04/30/2016  . High anion gap metabolic acidosis 66/29/4765  . Chest pain, atypical 12/19/2013  . Cyclical vomiting syndrome 05/08/2013  . Persistent vomiting 04/27/2013  . Anxiety state 09/13/2012  . Psychosis ([place]) 09/13/2012  . Marijuana abuse 08/13/2012  . Cannabis abuse 08/13/2012     Chief Complaint  Patient presents with  . SEXUALLY TRANSMITTED DISEASE    screening    HPI  Patient reports that he has had a "rash" in his genital area for 3-4 days.  States that his partner is pregnant and all of her testing has been negative so he is not sure why he has a rash.  States that he has changed soap/body wash recently but had been using this for a while before he got the rash. States that he is not sure if he has been bitten by something or it was the change of soap.  Reports that he has been self-treating with H2O2, Alcohol and hydrocortisone cream.  Denies chronic conditions, surgeries and regular medicines.     See flowsheet for further details and programmatic requirements.    The following portions of the patient's history were reviewed and updated as appropriate: allergies, current medications, past medical history, past social history, past surgical history and problem list.  Objective:  There were no vitals filed for this visit.  Physical Exam Constitutional:      General: He is not in acute distress.    Appearance: Normal appearance.  HENT:     Head: Normocephalic and atraumatic.   Comments: No nits, lice, or hair loss. No cervical, supraclavicular or axillary adenopathy.    Mouth/Throat:     Mouth: Mucous membranes are moist.     Pharynx: Oropharynx is clear. No oropharyngeal exudate or posterior oropharyngeal erythema.  Eyes:     Conjunctiva/sclera: Conjunctivae normal.  Pulmonary:     Effort: Pulmonary effort is normal.  Abdominal:     Palpations: Abdomen is soft. There is no mass.     Tenderness: There is no abdominal tenderness. There is no guarding or rebound.  Genitourinary:    Penis: Normal.      Testes: Normal.     Comments: Pubic area without nits, lice, edema, erythema, and inguinal adenopathy. Penis circumcised, without discharge at meatus. Dorsal side of penis on R side with ~3cm x 0.5cm hypopigmented, slightly tender to culture, slightly ulcerative open lesion. Musculoskeletal:     Cervical back: Neck supple. No tenderness.  Skin:    General: Skin is warm and dry.     Findings: No bruising, erythema, lesion or rash.  Neurological:     Mental Status: He is alert and oriented to person, place, and time.  Psychiatric:        Mood and Affect: Mood normal.        Behavior: Behavior normal.        Thought Content: Thought content normal.        Judgment: Judgment normal.       Assessment and Plan:  James Sanchez is a 29 y.o.  male presenting to the [facility] Department for STI screening  1. Screening for STD (sexually transmitted disease) Patient into clinic with symptoms. Rec condoms with all sex. Await test results.  Counseled that RN will call if needs to RTC for further treatment once results are back.  - Gram stain - Gonococcus culture - Gonococcus culture - HIV [place] [facility] - Syphilis Serology, [place] [facility] - Virology, [place] [facility]  2. NGU (nongonococcal urethritis) Will treat for NGU with Azithromycin 1g po DOT today. No sex for 7 days and until after partner completes treatment. RTC for re-treatment if vomits < 2 hr  after taking medicine. - azithromycin (ZITHROMAX) tablet 1,000 mg  3. Prophylactic measure Counseled patient re:  HSV-dz, transmission, subclinical dz and treatment options. Patient accepts offered prophylactic treatment while waiting on culture results. Acyclovir 400mg  #30 1 po TID for 10 days. Call with questions or concerns. - acyclovir (ZOVIRAX) 400 MG tablet; Take 1 tablet (400 mg total) by mouth 3 (three) times daily for 10 days.  Dispense: 30 tablet; Refill: 0     No follow-ups on file.  No future appointments.  , PA

## 2020-01-24 ENCOUNTER — Telehealth: Payer: Self-pay | Admitting: Family Medicine

## 2020-01-24 LAB — GONOCOCCUS CULTURE

## 2020-01-24 NOTE — Telephone Encounter (Signed)
Call to patient to discuss HSV TR.  Patient verified by pass word.  Patient informed of + TR and offered treatment options.  Patient elects to take episodic treatment and wants to RX sent to [facility] in [place].  Patient informed that RX would be sent at the end of business hours today. Patient's questions answered and not other further questions at this time Patient encouraged to call or sign up for mychart for any communication needs.  Wendi Snipes, RN

## 2020-01-25 ENCOUNTER — Other Ambulatory Visit: Payer: Self-pay | Admitting: Physician Assistant

## 2020-01-25 DIAGNOSIS — Z299 Encounter for prophylactic measures, unspecified: Secondary | ICD-10-CM

## 2020-01-25 MED ORDER — ACYCLOVIR 800 MG PO TABS: 800.0000 mg | ORAL_TABLET | Freq: Three times a day (TID) | ORAL | 12 refills | Status: DC

## 2020-01-25 NOTE — Progress Notes (Signed)
Patient counseled by RN re:  HSV results and per RN note, desires to take medicine episodically.  Requests that RX be sent to [facility].  Rx for Acyclovir 800mg  #6 1 po TID for 2 days prn with refills for 1 year will be sent to patient's pharmacy of choice.

## 2021-05-03 ENCOUNTER — Emergency Department
Admission: EM | Admit: 2021-05-03 | Discharge: 2021-05-03 | Disposition: A | Discharge status: Home / Self Care | Payer: Self-pay | Attending: Emergency Medicine | Admitting: Emergency Medicine

## 2021-05-03 ENCOUNTER — Other Ambulatory Visit: Payer: Self-pay

## 2021-05-03 ENCOUNTER — Emergency Department: Payer: Self-pay

## 2021-05-03 DIAGNOSIS — Z87891 Personal history of nicotine dependence: Secondary | ICD-10-CM | POA: Insufficient documentation

## 2021-05-03 DIAGNOSIS — M5412 Radiculopathy, cervical region: Secondary | ICD-10-CM | POA: Insufficient documentation

## 2021-05-03 MED ORDER — METHYLPREDNISOLONE 4 MG PO TBPK: ORAL_TABLET | ORAL | 0 refills | Status: DC

## 2021-05-03 NOTE — ED Notes (Signed)
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

## 2021-05-03 NOTE — Discharge Instructions (Addendum)
Read and follow discharge care instruction.  Take medication as directed.  If no improvement follow-up with neurology for further evaluation.  Wear arm sling for 2 to 3 days as needed.

## 2021-05-03 NOTE — ED Triage Notes (Signed)
Pt c/o having neck pain that started yesterday and today having pan and numbness into the right arm, pt has FROM

## 2021-05-03 NOTE — ED Notes (Signed)
Shoulder immobilizer applied with no issue. Pt tolerated well.

## 2021-05-03 NOTE — ED Provider Notes (Signed)
[facility] Emergency Department Provider Note   ____________________________________________   Event Date/Time   First MD Initiated Contact with Patient 05/03/21 1004     (approximate)  I have reviewed the triage vital signs and the nursing notes.   HISTORY  Chief Complaint Neck Pain    HPI James Sanchez is a 30 y.o. male patient complain of nontraumatic neck pain with a radicular component to the right upper extremity.  Patient stated knee pain started yesterday at work.  Patient stated last night he started experiencing numbness to the right upper extremity which has increased with a.m. awakening.  Denies limited range of motion.  Patient also states he feels like his right hand is swollen.  Patient is right-hand dominant.  Rates neck pain as 8/10.  Described pain as "achy".  No palliative measure for complaint.         Past Medical History:  Diagnosis Date   Cyclical vomiting with nausea    Strep throat     Patient Active Problem List   Diagnosis Date Noted   Abdominal pain 10/09/2018   Intractable nausea and vomiting 10/05/2018   Hypokalemia 06/05/2016   Intravascular volume depletion 06/05/2016   Cannabinoid hyperemesis syndrome 04/30/2016   High anion gap metabolic acidosis 04/29/2016   Chest pain, atypical 12/19/2013   Cyclical vomiting syndrome 05/08/2013   Persistent vomiting 04/27/2013   Anxiety state 09/13/2012   Psychosis (H[facility]) 09/13/2012   Marijuana abuse 08/13/2012   Cannabis abuse 08/13/2012    Past Surgical History:  Procedure Laterality Date   ESOPHAGOGASTRODUODENOSCOPY N/A 10/10/2018   Procedure: ESOPHAGOGASTRODUODENOSCOPY (EGD);  Surgeon: Toney Reil, MD;  Location: [facility] ENDOSCOPY;  Service: Gastroenterology;  Laterality: N/A;    Prior to Admission medications   Medication Sig Start Date End Date Taking? Authorizing Provider  methylPREDNISolone (MEDROL DOSEPAK) 4 MG TBPK tablet Take Tapered dose as directed  05/03/21  Yes Joni Reining, PA-C  acyclovir (ZOVIRAX) 800 MG tablet Take 1 tablet (800 mg total) by mouth 3 (three) times daily. Take 1 po TID for 2 days as needed. 01/25/20   Matt Holmes, PA  amitriptyline (ELAVIL) 50 MG tablet Take 1 tablet (50 mg total) by mouth at bedtime. 10/11/18   Mayo, Allyn Kenner, MD  amoxicillin (AMOXIL) 500 MG capsule Take 1 capsule (500 mg total) by mouth 3 (three) times daily. 09/10/19   Lorre Munroe, NP  ciprofloxacin (CILOXAN) 0.3 % ophthalmic ointment Place 2 drops onto your left eye every 2 hours while awake for the next 2 days and then 2 drops every 4 hours while awake for the next 5 days. 02/01/19   Bill Salinas, PA-C  HYDROcodone-acetaminophen (NORCO) 5-325 MG tablet Take 1 tablet by mouth every 4 (four) hours as needed for moderate pain. Patient not taking: Reported on 10/22/2018 10/09/18   Arnaldo Natal, MD  hydrOXYzine (VISTARIL) 25 MG capsule Take 25 mg by mouth 3 (three) times daily. 10/20/18   [provider]  ondansetron (ZOFRAN ODT) 4 MG disintegrating tablet Take 1 tablet (4 mg total) by mouth every 8 (eight) hours as needed. 10/01/18   Nita Sickle, MD  pantoprazole (PROTONIX) 40 MG tablet Take 1 tablet (40 mg total) by mouth 2 (two) times daily before a meal. 10/11/18   Mayo, Allyn Kenner, MD  Polyethylene Glycol 3350 (PEG 3350) POWD Take 17 g by mouth daily. 10/20/18   [provider]  promethazine (PHENERGAN) 25 MG tablet Take 1 tablet (25 mg total)  by mouth every 6 (six) hours as needed for nausea, vomiting or refractory nausea / vomiting. 10/11/18   Mayo, Allyn Kenner, MD  senna-docusate (SENOKOT-S) 8.6-50 MG tablet Take 2 tablets by mouth Nightly. 10/20/18   [provider]    Allergies Aspirin and Sulfa antibiotics  Family History  Problem Relation Age of Onset   Asthma Mother    Diabetes Maternal Grandmother    Diabetes Maternal Grandfather    Diabetes Paternal Grandmother    Diabetes Paternal Grandfather      Social History Social History   Tobacco Use   Smoking status: Former   Smokeless tobacco: Never  Substance Use Topics   Alcohol use: Yes    Comment: occasionally   Drug use: Yes    Types: Marijuana    Comment: 2 weeks ago.     Review of Systems  Constitutional: No fever/chills Eyes: No visual changes. ENT: No sore throat. Cardiovascular: Denies chest pain. Respiratory: Denies shortness of breath. Gastrointestinal: No abdominal pain.  No nausea, no vomiting.  No diarrhea.  No constipation. Genitourinary: Negative for dysuria. Musculoskeletal: Neck pain.   Skin: Negative for rash. Neurological: Negative for headaches, focal weakness or numbness. Allergic/Immunilogical: Aspirin and sulfa antibiotics. ____________________________________________   PHYSICAL EXAM:  VITAL SIGNS: ED Triage Vitals  Enc Vitals Group     BP 05/03/21 0949 140/72     Pulse Rate 05/03/21 0949 89     Resp 05/03/21 0949 17     Temp --      Temp Source 05/03/21 0949 Oral     SpO2 05/03/21 0949 97 %     Weight 05/03/21 0951 300 lb (136.1 kg)     Height 05/03/21 0951 5\' 8"  (1.727 m)     Head Circumference --      Peak Flow --      Pain Score 05/03/21 0950 8     Pain Loc --      Pain Edu? --      Excl. in GC? --     Constitutional: Alert and oriented. Well appearing and in no acute distress. Eyes: Conjunctivae are normal. PERRL. EOMI. Head: Atraumatic. Nose: No congestion/rhinnorhea. Mouth/Throat: Mucous membranes are moist.  Oropharynx non-erythematous. Neck: No stridor.  No cervical spine tenderness to palpation.  Full and equal range of motion. Hematological/Lymphatic/Immunilogical: No cervical lymphadenopathy. Cardiovascular: Normal rate, regular rhythm. Grossly normal heart sounds.  Good peripheral circulation. Respiratory: Normal respiratory effort.  No retractions. Lungs CTAB. Genitourinary: Deferred Musculoskeletal: No lower extremity tenderness nor edema.  No joint  effusions. Neurologic:  Normal speech and language. No gross focal neurologic deficits are appreciated. No gait instability. Skin:  Skin is warm, dry and intact. No rash noted. Psychiatric: Mood and affect are normal. Speech and behavior are normal.  ____________________________________________   LABS (all labs ordered are listed, but only abnormal results are displayed)  Labs Reviewed - No data to display ____________________________________________  EKG   ____________________________________________  RADIOLOGY I, 05/05/21, personally viewed and evaluated these images (plain radiographs) as part of my medical decision making, as well as reviewing the written report by the radiologist.  ED MD interpretation:    Official radiology report(s): DG Cervical Spine Complete  Result Date: 05/03/2021 CLINICAL DATA:  Neck pain with radicular component to the right upper extremity. EXAM: CERVICAL SPINE - COMPLETE 7 VIEW COMPARISON:  None. FINDINGS: The cervical spine is visualized from C1-C6 on lateral imaging. Mild osseous neuroforaminal narrowing of RIGHT C5-6 secondary to facet arthropathy and uncovertebral  hypertrophy.Cervical alignment is maintained. Vertebral body heights are maintained: no evidence of acute fracture. Intervertebral spaces are maintained without significant degenerative changes. No prevertebral soft tissue swelling. Visualized thorax is unremarkable. IMPRESSION: Mild RIGHT-sided osseous neuroforaminal narrowing at RIGHT C5-6. Electronically Signed   By: Meda Klinefelter M.D.   On: 05/03/2021 11:05    ____________________________________________   PROCEDURES  Procedure(s) performed (including Critical Care):  Procedures   ____________________________________________   INITIAL IMPRESSION / ASSESSMENT AND PLAN / ED COURSE  As part of my medical decision making, I reviewed the following data within the electronic MEDICAL RECORD NUMBER         Patient  complaining of neck pain with radicular component to the right upper extremity.  Discussed x-ray findings with patient showing a narrowing C5-C6.  Patient complaining physical exam consistent with cervical radiculopathy.  Patient placed in arm sling and given a prescription for Medrol Dosepak.  Patient advised to follow-up with PCP to consider consult to neurology if no improvement in 3 to 5 days.  Return to ED if condition worsens.     ____________________________________________   FINAL CLINICAL IMPRESSION(S) / ED DIAGNOSES  Final diagnoses:  Cervical radiculopathy at C6     ED Discharge Orders          Ordered    methylPREDNISolone (MEDROL DOSEPAK) 4 MG TBPK tablet        05/03/21 1116             Note:  This document was prepared using Dragon voice recognition software and may include unintentional dictation errors.    Joni Reining, PA-C 05/04/21 0710    Jene Every, MD 05/07/21 [phone]

## 2021-10-08 ENCOUNTER — Ambulatory Visit: Payer: Self-pay | Admitting: Family Medicine

## 2021-10-08 ENCOUNTER — Encounter: Payer: Self-pay | Admitting: Family Medicine

## 2021-10-08 ENCOUNTER — Other Ambulatory Visit: Payer: Self-pay

## 2021-10-08 DIAGNOSIS — Z113 Encounter for screening for infections with a predominantly sexual mode of transmission: Secondary | ICD-10-CM

## 2021-10-08 DIAGNOSIS — Z8619 Personal history of other infectious and parasitic diseases: Secondary | ICD-10-CM

## 2021-10-08 LAB — GRAM STAIN

## 2021-10-08 LAB — HM HIV SCREENING LAB: HM HIV Screening: NEGATIVE

## 2021-10-08 MED ORDER — ACYCLOVIR 800 MG PO TABS: 800.0000 mg | ORAL_TABLET | Freq: Three times a day (TID) | ORAL | 12 refills | Status: AC

## 2021-10-08 NOTE — Progress Notes (Signed)
Gram stain reviewed, no treatment indicated..Joslyne Marshburn Brewer-Jensen, RN  ?

## 2021-10-08 NOTE — Progress Notes (Signed)
[facility] Department STI clinic/screening visit  Subjective:  James Sanchez is a 31 y.o. male being seen today for an STI screening visit. The patient reports they do have symptoms.    Patient has the following medical conditions:   Patient Active Problem List   Diagnosis Date Noted   Abdominal pain 10/09/2018   Intractable nausea and vomiting 10/05/2018   Hypokalemia 06/05/2016   Intravascular volume depletion 06/05/2016   Cannabinoid hyperemesis syndrome 04/30/2016   High anion gap metabolic acidosis [id number]   Chest pain, atypical [id number]   Cyclical vomiting syndrome 05/08/2013   Persistent vomiting 04/27/2013   Anxiety state 09/13/2012   Psychosis ([place]) 09/13/2012   Marijuana abuse 08/13/2012   Cannabis abuse 08/13/2012     Chief Complaint  Patient presents with   SEXUALLY TRANSMITTED DISEASE    screening    HPI  Patient reports he 2 HSV lesions on his penis since last Friday.  He usually takes Acyclovir for HSV but is out of meds.  Would like to have an STD screen today and refill on Acyclovir 800 mg.  Does the patient or their partner desires a pregnancy in the next year? No  Screening for MPX risk: Does the patient have an unexplained rash? No Is the patient MSM? No Does the patient endorse multiple sex partners or anonymous sex partners? No Did the patient have close or sexual contact with a person diagnosed with MPX? No Has the patient traveled outside the [place] where MPX is endemic? No Is there a high clinical suspicion for MPX-- evidenced by one of the following No  -Unlikely to be chickenpox  -Lymphadenopathy  -Rash that present in same phase of evolution on any given body part   See flowsheet for further details and programmatic requirements.    The following portions of the patient's history were reviewed and updated as appropriate: allergies, current medications, past medical history, past social history, past surgical history and  problem list.  Objective:  There were no vitals filed for this visit.  Physical Exam Constitutional:      Appearance: Normal appearance.  HENT:     Head: Normocephalic and atraumatic.     Comments: No nits or hair loss    Mouth/Throat:     Mouth: Mucous membranes are moist.     Pharynx: Oropharynx is clear. No oropharyngeal exudate or posterior oropharyngeal erythema.  Pulmonary:     Effort: Pulmonary effort is normal.  Abdominal:     General: Abdomen is flat.     Palpations: Abdomen is soft. There is no hepatomegaly or mass.     Tenderness: There is no abdominal tenderness.  Genitourinary:    Pubic Area: No rash or pubic lice.      Penis: Circumcised. Lesions present.      Testes: Normal.     Epididymis:     Right: Normal.     Left: Normal.     Comments: Penis- 2 erythematous lesion noted on ventral area of shaft, non-tender, no disch. Lymphadenopathy:     Head:     Right side of head: No preauricular or posterior auricular adenopathy.     Left side of head: No preauricular or posterior auricular adenopathy.     Cervical: No cervical adenopathy.     Upper Body:     Right upper body: No supraclavicular or axillary adenopathy.     Left upper body: No supraclavicular or axillary adenopathy.     Lower Body: No right inguinal adenopathy. No  left inguinal adenopathy.  Skin:    General: Skin is warm and dry.     Findings: No rash.  Neurological:     Mental Status: He is alert and oriented to person, place, and time.      Assessment and Plan:  Lamarr Losch is a 31 y.o. male presenting to the [facility] Department for STI screening  1. Screening examination for venereal disease  - HIV [place] [facility] - Syphilis Serology, [place] [facility] - Gram stain - Gonococcus culture - Gonococcus culture  2. History of herpes genitalis  - acyclovir (ZOVIRAX) 800 MG tablet; Take 1 tablet (800 mg total) by mouth 3 (three) times daily for 2 days.  Dispense: 6 tablet; Refill:  12 Co. To abstain from sexual activity when he has an outbreak.   Co. To use condoms for STD prevention    Return if symptoms worsen or fail to improve.  No future appointments.  Hassell Done, FNP

## 2021-10-13 LAB — GONOCOCCUS CULTURE

## 2021-10-16 ENCOUNTER — Emergency Department
Admission: EM | Admit: 2021-10-16 | Discharge: 2021-10-16 | Disposition: A | Discharge status: Home / Self Care | Payer: Self-pay | Attending: Emergency Medicine | Admitting: Emergency Medicine

## 2021-10-16 ENCOUNTER — Encounter: Payer: Self-pay | Admitting: Emergency Medicine

## 2021-10-16 ENCOUNTER — Emergency Department: Payer: Self-pay

## 2021-10-16 DIAGNOSIS — J039 Acute tonsillitis, unspecified: Secondary | ICD-10-CM

## 2021-10-16 DIAGNOSIS — J029 Acute pharyngitis, unspecified: Secondary | ICD-10-CM | POA: Insufficient documentation

## 2021-10-16 DIAGNOSIS — D72829 Elevated white blood cell count, unspecified: Secondary | ICD-10-CM | POA: Insufficient documentation

## 2021-10-16 LAB — BASIC METABOLIC PANEL
Anion gap: 14 (ref 5–15)
BUN: 15 mg/dL (ref 6–20)
CO2: 22 mmol/L (ref 22–32)
Calcium: 9.3 mg/dL (ref 8.9–10.3)
Chloride: 101 mmol/L (ref 98–111)
Creatinine, Ser: 1.02 mg/dL (ref 0.61–1.24)
GFR, Estimated: >60 mL/min (ref >60)
Glucose, Bld: 105 mg/dL — ABNORMAL HIGH (ref 70–99)
Potassium: 4.7 mmol/L (ref 3.5–5.1)
Sodium: 137 mmol/L (ref 135–145)

## 2021-10-16 LAB — CBC WITH DIFFERENTIAL/PLATELET
Abs Immature Granulocytes: 0.03 10*3/uL (ref 0.00–0.07)
Basophils Absolute: 0 10*3/uL (ref 0.0–0.1)
Basophils Relative: 0 %
Eosinophils Absolute: 0 10*3/uL (ref 0.0–0.5)
Eosinophils Relative: 0 %
HCT: 44.8 % (ref 39.0–52.0)
Hemoglobin: 14.8 g/dL (ref 13.0–17.0)
Immature Granulocytes: 0 %
Lymphocytes Relative: 15 %
Lymphs Abs: 1.9 10*3/uL (ref 0.7–4.0)
MCH: 28.6 pg (ref 26.0–34.0)
MCHC: 33 g/dL (ref 30.0–36.0)
MCV: 86.5 fL (ref 80.0–100.0)
Monocytes Absolute: 1.2 10*3/uL — ABNORMAL HIGH (ref 0.1–1.0)
Monocytes Relative: 9 %
Neutro Abs: 10 10*3/uL — ABNORMAL HIGH (ref 1.7–7.7)
Neutrophils Relative %: 76 %
Platelets: 377 10*3/uL (ref 150–400)
RBC: 5.18 MIL/uL (ref 4.22–5.81)
RDW: 13.2 % (ref 11.5–15.5)
WBC: 13.1 10*3/uL — ABNORMAL HIGH (ref 4.0–10.5)
nRBC: 0 % (ref 0.0–0.2)

## 2021-10-16 MED ORDER — CEPHALEXIN 500 MG PO CAPS: 500.0000 mg | ORAL_CAPSULE | Freq: Three times a day (TID) | ORAL | 0 refills | Status: DC

## 2021-10-16 MED ORDER — ONDANSETRON HCL 4 MG/2ML IJ SOLN
4.0000 mg | Freq: Once | INTRAMUSCULAR | Status: AC
  Administered 2021-10-16: 4 mg via INTRAVENOUS
  Filled 2021-10-16: qty 2

## 2021-10-16 MED ORDER — IOHEXOL 300 MG/ML  SOLN
75.0000 mL | Freq: Once | INTRAMUSCULAR | Status: AC | PRN
  Administered 2021-10-16: 75 mL via INTRAVENOUS

## 2021-10-16 MED ORDER — DEXAMETHASONE SODIUM PHOSPHATE 10 MG/ML IJ SOLN
10.0000 mg | Freq: Once | INTRAMUSCULAR | Status: AC
  Administered 2021-10-16: 10 mg via INTRAVENOUS
  Filled 2021-10-16: qty 1

## 2021-10-16 MED ORDER — ACETAMINOPHEN 500 MG PO TABS
1000.0000 mg | ORAL_TABLET | Freq: Once | ORAL | Status: AC
  Administered 2021-10-16: 1000 mg via ORAL
  Filled 2021-10-16: qty 2

## 2021-10-16 MED ORDER — SODIUM CHLORIDE 0.9 % IV SOLN
3.0000 g | Freq: Once | INTRAVENOUS | Status: AC
  Administered 2021-10-16: 3 g via INTRAVENOUS
  Filled 2021-10-16: qty 8

## 2021-10-16 NOTE — ED Triage Notes (Signed)
Pt to ED from home c/o SOB, trouble swallowing, throat closing.  States woke up like this, went to bed fine.  Sore throat, unable to swallow saliva.  No space noted between tonsils and uvula.  Denies known cause.  Pt alert, unable to speak clearly, chest rise even and unlabored at this time. ?

## 2021-10-16 NOTE — ED Provider Notes (Signed)
? ?[facility] ?Provider Note ? ? ? Event Date/Time  ? First MD Initiated Contact with Patient 10/16/21 [phone]   ?  (approximate) ? ? ?History  ? ?Oral Swelling ? ? ?HPI ? ?James Sanchez is a 31 y.o. male past medical history of cyclic vomiting who presents with throat pain and difficulty swallowing.  Yesterday was feeling fine and then when he woke up this morning he had severe pain in the back of his throat.  Difficulty swallowing with some shortness of breath.  Voice changes well.  He endorses pain in his neck right greater than left.  Denies any history of angioedema or ACE inhibitor use. ?  ? ?Past Medical History:  ?Diagnosis Date  ? Cyclical vomiting with nausea   ? Strep throat   ? ? ?Patient Active Problem List  ? Diagnosis Date Noted  ? Abdominal pain 10/09/2018  ? Intractable nausea and vomiting 10/05/2018  ? Hypokalemia 06/05/2016  ? Intravascular volume depletion 06/05/2016  ? Cannabinoid hyperemesis syndrome 04/30/2016  ? High anion gap metabolic acidosis [id number]  ? Chest pain, atypical 12/19/2013  ? Cyclical vomiting syndrome 05/08/2013  ? Persistent vomiting 04/27/2013  ? Anxiety state 09/13/2012  ? Psychosis ([place]) 09/13/2012  ? Marijuana abuse 08/13/2012  ? Cannabis abuse 08/13/2012  ? ? ? ?Physical Exam  ?Triage Vital Signs: ?ED Triage Vitals [10/16/21 0645]  ?Enc Vitals Group  ?   BP 129/82  ?   Pulse Rate (!) 104  ?   Resp 20  ?   Temp 99 ?F (37.2 ?C)  ?   Temp Source Oral  ?   SpO2 94 %  ?   Weight   ?   Height   ?   Head Circumference   ?   Peak Flow   ?   Pain Score   ?   Pain Loc   ?   Pain Edu?   ?   Excl. in [place]?   ? ? ?Most recent vital signs: ?Vitals:  ? 10/16/21 0645  ?BP: 129/82  ?Pulse: (!) 104  ?Resp: 20  ?Temp: 99 ?F (37.2 ?C)  ?SpO2: 94%  ? ? ? ?General: Awake, no distress ?CV:  Good peripheral perfusion.  ?Resp:  Normal effort.  ?Abd:  No distention.  ?Neuro:             Awake, Alert, Oriented x 3  ?Other:  Patient has hoarseness, spitting up secretions, no  trismus, there is significant erythema of the soft palate and tonsils with bilateral swelling, uvula is midline and significantly enlarged ?Bilateral cervical lymphadenopathy tender to palpation right greater than left ? ? ?ED Results / Procedures / Treatments  ?Labs ?(all labs ordered are listed, but only abnormal results are displayed) ?Labs Reviewed  ?CBC WITH DIFFERENTIAL/PLATELET  ?BASIC METABOLIC PANEL  ? ? ? ?EKG ? ? ? ? ?RADIOLOGY ?Pending at signout ? ? ?PROCEDURES: ? ?Critical Care performed: No ? ?Procedures ? ?The patient is on the cardiac monitor to evaluate for evidence of arrhythmia and/or significant heart rate changes. ? ? ?MEDICATIONS ORDERED IN ED: ?Medications  ?ondansetron (ZOFRAN) injection 4 mg (has no administration in time range)  ?dexamethasone (DECADRON) injection 10 mg (has no administration in time range)  ? ? ? ?IMPRESSION / MDM / ASSESSMENT AND PLAN / ED COURSE  ?I reviewed the triage vital signs and the nursing notes. ?             ?               ? ?  Differential diagnosis includes, but is not limited to, pharyngitis, PTA, retropharyngeal abscess, epiglottitis ? ?Patient is a 31 year old male who presents with throat pain difficulty swallowing x1 day.  Was previously feeling well yesterday.  Woke up with sore throat and hoarseness difficulty swallowing.  On exam he is hoarse, spitting up his secretions but protecting his airway.  No stridor.  He has significant erythema of the soft palate and posterior oropharynx and tonsils and uvula is enlarged.  Do not appreciate any asymmetry uvula is midline however patient minimally tolerant of exam with tongue depressor.  He does have bilateral cervical adenopathy.  Suspect severe tonsillitis but given the severity of his symptoms will obtain a CT to rule out PTA and any further airway involvement including epiglottitis, pharyngeal abscess etc. with the pain and inflammation my suspicion for angioedema is low.  Patient also has no risk factors  for this and no history of it.  My suspicion for epiglottitis overall low he is not having stridor is not febrile.  We will have him on the monitor and continue to observe closely for any progression to airway involvement.  He is currently protecting his airway.  Patient signed out to oncoming provider pending meds imaging and labs and reassessment. ? ?  ? ? ?FINAL CLINICAL IMPRESSION(S) / ED DIAGNOSES  ? ?Final diagnoses:  ?Pharyngitis, unspecified etiology  ? ? ? ?Rx / DC Orders  ? ?ED Discharge Orders   ? ? None  ? ?  ? ? ? ?Note:  This document was prepared using Dragon voice recognition software and may include unintentional dictation errors. ?  ?Rada Hay, MD ?10/16/21 [id number] ? ?

## 2021-10-16 NOTE — ED Notes (Signed)
Pt to CT

## 2021-10-16 NOTE — ED Notes (Signed)
See triage note, reports woke up with throat swelling. Pt spitting saliva into emesis bag. No swelling noted to tongue. Call bell in reach. Denies needs at this time.  ?

## 2021-10-16 NOTE — Discharge Instructions (Addendum)
Take your antibiotics as prescribed starting today.  Please use Tylenol or ibuprofen every 6 hours as needed for fever/discomfort.  You may also use over-the-counter lozenges or Chloraseptic sprays as needed for discomfort as written on the box.  Return to the emergency department for any trouble breathing or swallowing or for any other symptom personally concerning to yourself.  Otherwise please follow-up with a primary care physician within the next 1 to 2 days for recheck/reevaluation. ?

## 2021-10-16 NOTE — ED Provider Notes (Signed)
----------------------------------------- ?  11:22 AM on 10/16/2021 ?----------------------------------------- ?Patient care assumed from Dr. Sidney Ace.  Patient's work-up is overall reassuring slight leukocytosis at 13,000 otherwise reassuring chemistry and CBC.  I have personally reviewed the CT images of the neck, no obstructive pathology seen on my evaluation.  CT read as tonsillitis without abscess with reactive cervical lymphadenopathy.  Patient has received Decadron.  I have ordered IV Unasyn for the patient as well.  Highly suspect tonsillitis to be the cause of the patient's discomfort and swelling sensation.  Patient is now able to speak well, was able to open widely open his mouth which shows erythema both tonsils as well as uvula, no uvular deviation but moderate tonsillar hypertrophy with exudates noted on bilateral tonsils consistent with likely streptococcal infection.  We will discharge patient on Keflex 3 times daily.  Discussed with the patient Tylenol or ibuprofen as needed for fever/discomfort as well as over-the-counter Chloraseptic spray.  Discussed return precautions with the patient he is agreeable to this plan of care. ?  ?Minna Antis, MD ?10/16/21 1123 ? ?

## 2022-01-20 ENCOUNTER — Other Ambulatory Visit: Payer: Self-pay

## 2022-01-20 ENCOUNTER — Encounter: Payer: Self-pay | Admitting: Emergency Medicine

## 2022-01-20 ENCOUNTER — Emergency Department
Admission: EM | Admit: 2022-01-20 | Discharge: 2022-01-20 | Disposition: A | Discharge status: Home / Self Care | Payer: Self-pay | Attending: Emergency Medicine | Admitting: Emergency Medicine

## 2022-01-20 DIAGNOSIS — J029 Acute pharyngitis, unspecified: Secondary | ICD-10-CM

## 2022-01-20 DIAGNOSIS — H66002 Acute suppurative otitis media without spontaneous rupture of ear drum, left ear: Secondary | ICD-10-CM

## 2022-01-20 LAB — GROUP A STREP BY PCR: Group A Strep by PCR: NOT DETECTED

## 2022-01-20 MED ORDER — DEXAMETHASONE SODIUM PHOSPHATE 10 MG/ML IJ SOLN
10.0000 mg | Freq: Once | INTRAMUSCULAR | Status: AC
  Administered 2022-01-20: 10 mg via INTRAMUSCULAR
  Filled 2022-01-20: qty 1

## 2022-01-20 MED ORDER — CEFTRIAXONE SODIUM 1 G IJ SOLR
1.0000 g | Freq: Once | INTRAMUSCULAR | Status: AC
  Administered 2022-01-20: 1 g via INTRAMUSCULAR
  Filled 2022-01-20: qty 10

## 2022-01-20 MED ORDER — HYDROCODONE-ACETAMINOPHEN 5-325 MG PO TABS
2.0000 | ORAL_TABLET | Freq: Once | ORAL | Status: AC
  Administered 2022-01-20: 2 via ORAL
  Filled 2022-01-20: qty 2

## 2022-01-20 MED ORDER — AMOXICILLIN-POT CLAVULANATE 875-125 MG PO TABS: 1.0000 | ORAL_TABLET | Freq: Two times a day (BID) | ORAL | 0 refills | Status: AC

## 2022-01-20 MED ORDER — LIDOCAINE HCL (PF) 1 % IJ SOLN
5.0000 mL | Freq: Once | INTRAMUSCULAR | Status: AC
  Administered 2022-01-20: 2.1 mL
  Filled 2022-01-20: qty 5

## 2022-01-20 MED ORDER — FLUTICASONE PROPIONATE 50 MCG/ACT NA SUSP: 1.0000 | Freq: Every day | NASAL | 0 refills | Status: AC

## 2022-01-20 MED ORDER — IBUPROFEN 600 MG PO TABS: 600.0000 mg | ORAL_TABLET | Freq: Three times a day (TID) | ORAL | 0 refills | Status: DC | PRN

## 2022-01-20 NOTE — ED Notes (Signed)
See triage note  presents with bilateral ear pressure and sore throat  states he feels like his throat is swelling   low grade temp on arrival

## 2022-01-20 NOTE — ED Triage Notes (Signed)
Patient reports pressure in both ears making it difficulty to hear for the past 2 days.  Reports woke this morning and throat feels swollen. Throat red, swollen.  Patient is able to control own saliva and is able to speak in complete sentences.

## 2022-01-20 NOTE — ED Notes (Signed)
Pt stated sore throat, and ear pain. Dr Erma Heritage in rm. Pt cao x 4, and ambulatory.

## 2022-01-20 NOTE — ED Provider Notes (Signed)
[facility] Provider Note    Event Date/Time   First MD Initiated Contact with Patient 01/20/22 [phone]     (approximate)   History   Sore Throat and Ear Fullness   HPI  James Sanchez is a 31 y.o. male  here with ear pain, throat fullness. Pt reports that for the past 3 days he has had bilateral ear pain, L>R. He describes it as a fullness, aching sensation. Over the past 24 hours, he has now developed sore throat, pain w/ swallowing. Feels like his voice is slightly different as well. No difficulty speaking or swallowing however. He had some chills, no documented fevers. No n/v. No known sick contacts. No drainage from the ears. No cough or SOB. No stridor.       Physical Exam   Triage Vital Signs: ED Triage Vitals  Enc Vitals Group     BP 01/20/22 0545 126/73     Pulse Rate 01/20/22 0545 85     Resp 01/20/22 0545 20     Temp 01/20/22 0545 99.1 F (37.3 C)     Temp Source 01/20/22 0545 Oral     SpO2 01/20/22 0545 97 %     Weight 01/20/22 0540 290 lb (131.5 kg)     Height 01/20/22 0540 5\' 7"  (1.702 m)     Head Circumference --      Peak Flow --      Pain Score 01/20/22 0540 10     Pain Loc --      Pain Edu? --      Excl. in [place]? --     Most recent vital signs: Vitals:   01/20/22 0545  BP: 126/73  Pulse: 85  Resp: 20  Temp: 99.1 F (37.3 C)  SpO2: 97%     General: Awake, no distress.  CV:  Good peripheral perfusion. RRR. Resp:  Normal effort. Lungs CTAB. No stridor. Abd:  No distention. Soft, NT. Other:  HEENT exam shows bilateral serous effusions of TM, with mild erythema and slight opacity on the left. Tonsils 3+ and swollen symmetrically, no overt exudates. No peritonsillar asymmetry or edema. Uvula is non edematous and midline. OP patent. Tongue soft, no swelling. Phonation normal.   ED Results / Procedures / Treatments   Labs (all labs ordered are listed, but only abnormal results are displayed) Labs Reviewed  GROUP A STREP BY  PCR     EKG    RADIOLOGY    I also independently reviewed and agree with radiologist interpretations.   PROCEDURES:  Critical Care performed: No  MEDICATIONS ORDERED IN ED: Medications  dexamethasone (DECADRON) injection 10 mg (has no administration in time range)  cefTRIAXone (ROCEPHIN) injection 1 g (has no administration in time range)  HYDROcodone-acetaminophen (NORCO/VICODIN) 5-325 MG per tablet 2 tablet (has no administration in time range)  lidocaine (PF) (XYLOCAINE) 1 % injection 5 mL (has no administration in time range)     IMPRESSION / MDM / ASSESSMENT AND PLAN / ED COURSE  I reviewed the triage vital signs and the nursing notes.                               Ddx:  Differential includes the following, with pertinent life- or limb-threatening emergencies considered:  Strep or other bacterial pharyngitis, AOM, URI, allergic rhinitis w/ serous effusions, angioedema  Patient's presentation is most consistent with acute illness / injury with system symptoms.  MDM:  31 yo M here with sore throat, ear pain, subjective chills. Pt is alert, non toxic, protecting airway. Tolerating secretions. Exam shows bilateral tonsillar swelling, L AOM, and some tender cervical adenopathy. No meningismus. No stridor or signs of airway compromise or epiglottitis. No evidence of RPA. Will treat empirically with ABX, Decadron. IM rocephin given here as well in event of poor PO intake today. Return precautions given in detail. Pt is not diabetic or immunosuppressed.   MEDICATIONS GIVEN IN ED: Medications  dexamethasone (DECADRON) injection 10 mg (has no administration in time range)  cefTRIAXone (ROCEPHIN) injection 1 g (has no administration in time range)  HYDROcodone-acetaminophen (NORCO/VICODIN) 5-325 MG per tablet 2 tablet (has no administration in time range)  lidocaine (PF) (XYLOCAINE) 1 % injection 5 mL (has no administration in time range)     Consults:  None   EMR  reviewed  Prior ED visits     FINAL CLINICAL IMPRESSION(S) / ED DIAGNOSES   Final diagnoses:  Pharyngitis, unspecified etiology  Non-recurrent acute suppurative otitis media of left ear without spontaneous rupture of tympanic membrane     Rx / DC Orders   ED Discharge Orders          Ordered    amoxicillin-clavulanate (AUGMENTIN) 875-125 MG tablet  2 times daily        01/20/22 0725    fluticasone (FLONASE) 50 MCG/ACT nasal spray  Daily        01/20/22 0725    ibuprofen (ADVIL) 600 MG tablet  Every 8 hours PRN        01/20/22 0725             Note:  This document was prepared using Dragon voice recognition software and may include unintentional dictation errors.   Shaune Pollack, MD 01/20/22 [phone]

## 2022-04-04 ENCOUNTER — Emergency Department
Admission: EM | Admit: 2022-04-04 | Discharge: 2022-04-05 | Discharge status: Against Medical Advice | Payer: Self-pay | Source: Home / Self Care

## 2022-07-31 IMAGING — CT CT NECK W/ CM
4 of 5 series · 14 of 33 positions shown, 16 images · IV contrast (agent unspecified)
Comparison: Cervical spine radiographs 05/03/2021

CLINICAL DATA: Throat pain, difficulty swallowing

EXAM:
CT NECK WITH CONTRAST
TECHNIQUE: Multidetector CT imaging of the neck was performed using the
standard protocol following the bolus administration of intravenous
contrast.

[Series 2: axial neck · axial · 0.51mm/px · z∈[-164,-52]mm · 3 of 113 slices shown]
[im 29/113  bone]
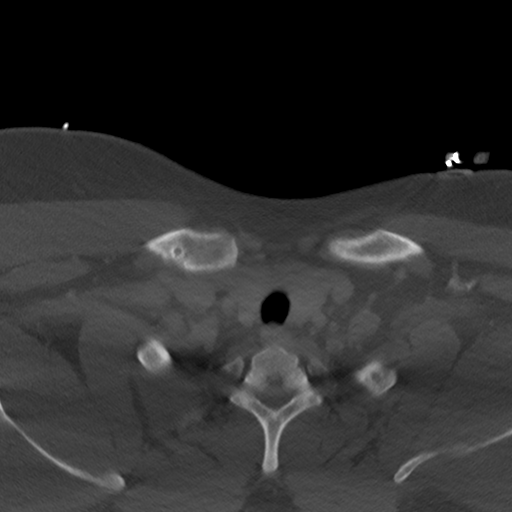
[im 57/113  bone]
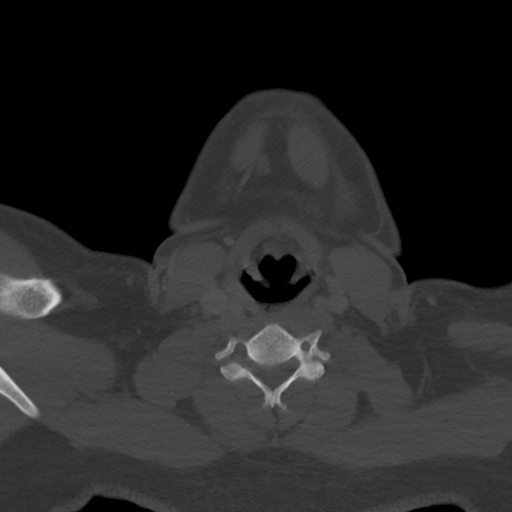
[im 85/113  bone]
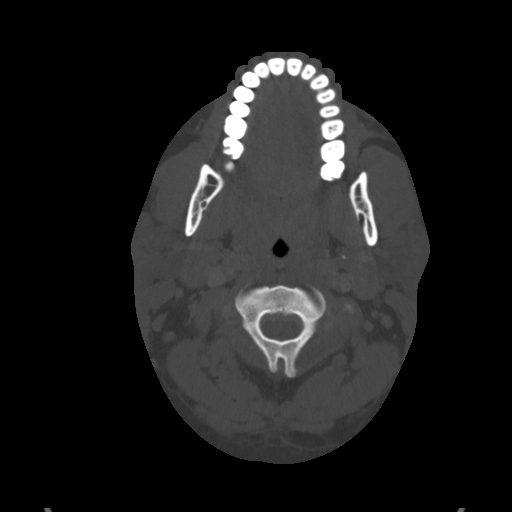

[Series 5: sag neck · sagittal · 0.55mm/px · 5 of 110 slices shown, 6 images]
[im 37/110  bone]
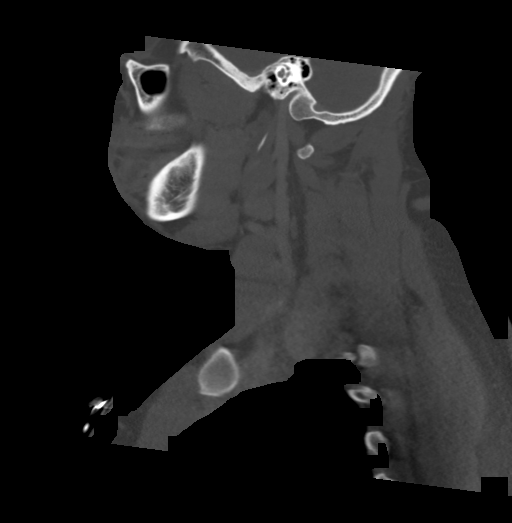
[im 46/110  bone]
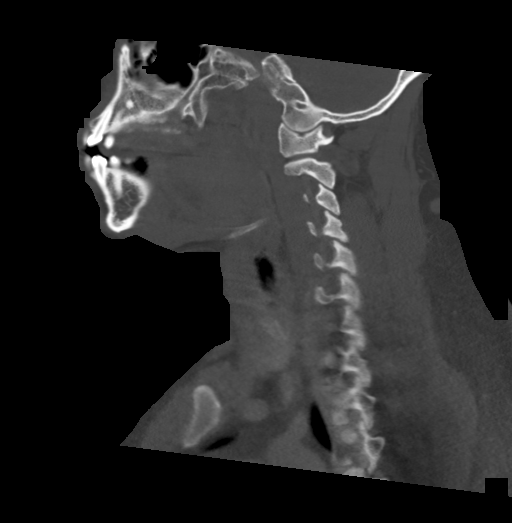
[im 55/110  soft-tissue]
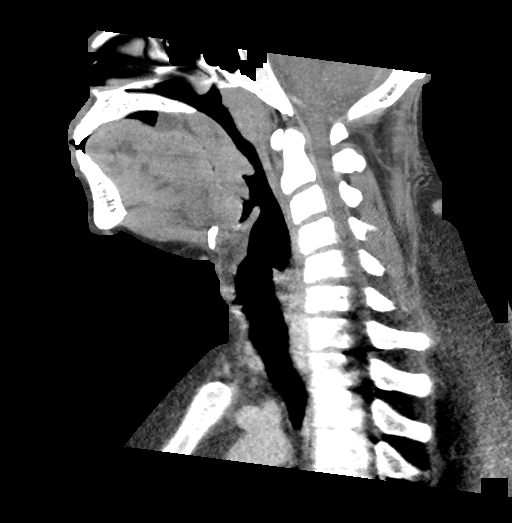
[im 55/110  bone]
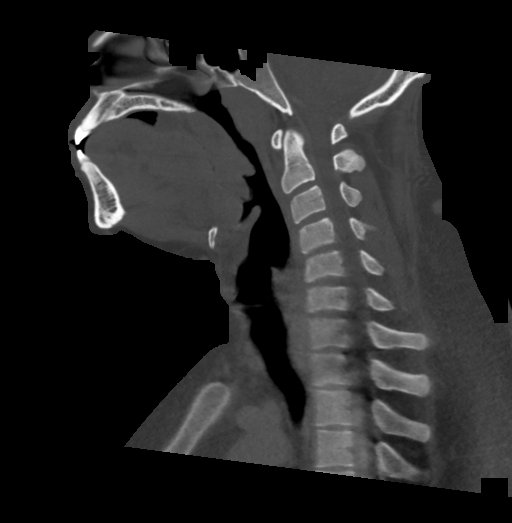
[im 64/110  bone]
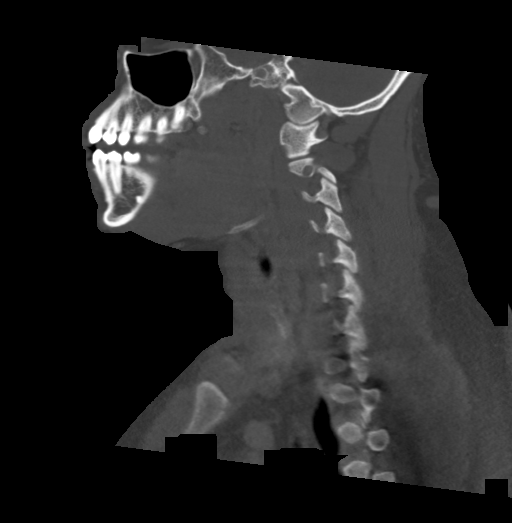
[im 73/110  bone]
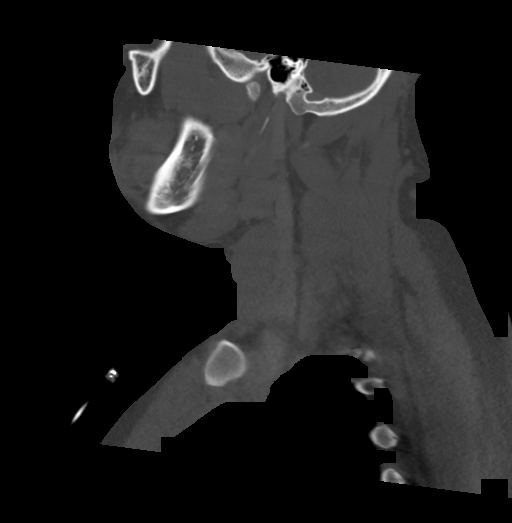

[Series 6: cor neck · coronal · 0.53mm/px · 3 of 125 slices shown]
[im 25/125  bone]
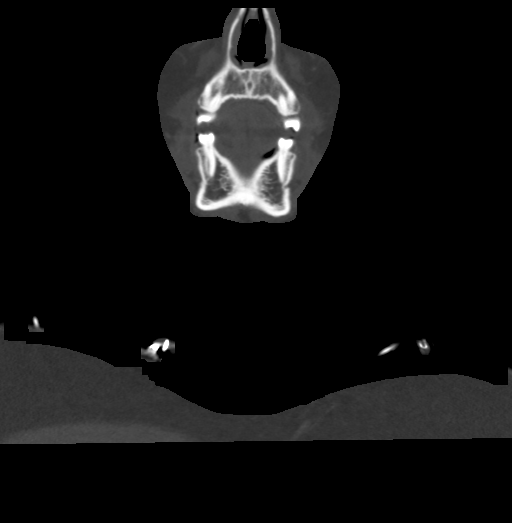
[im 50/125  bone]
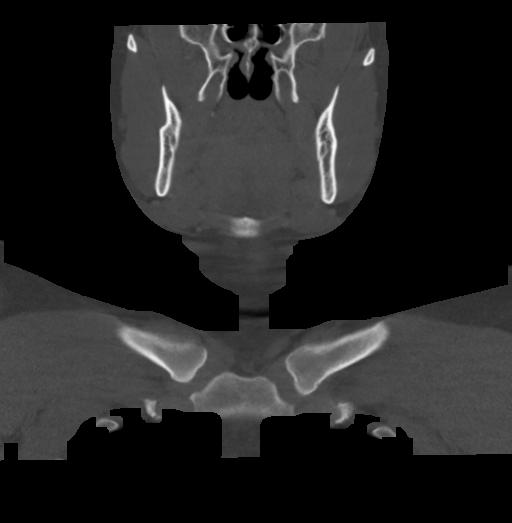
[im 75/125  bone]
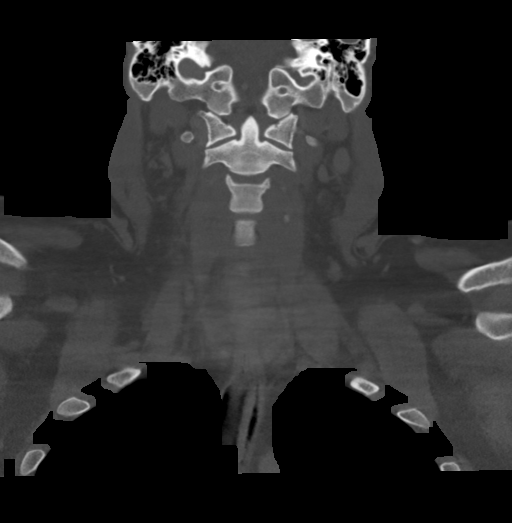

[Series 7: orthogonal (person_name) · axial · 0.45mm/px · z∈[-185,-55]mm · 3 of 133 slices shown, 4 images]
[im 34/133  soft-tissue]
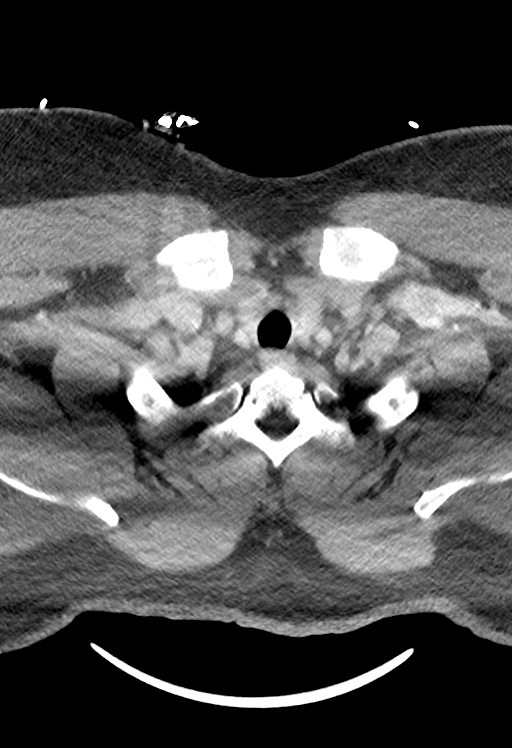
[im 34/133  bone]
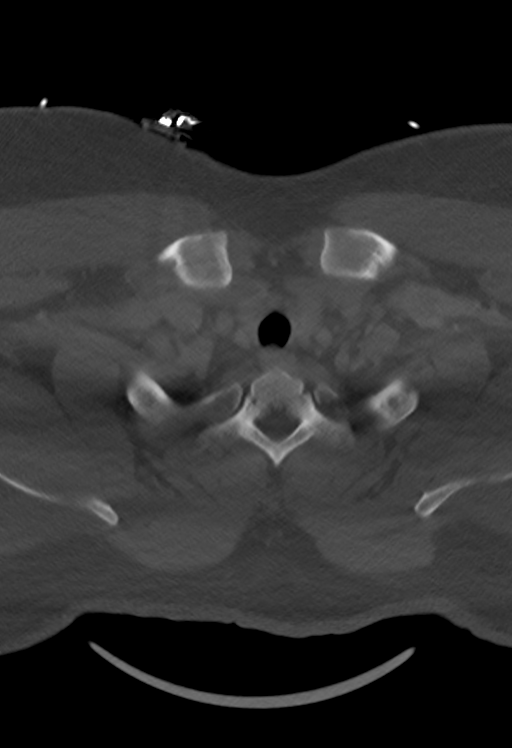
[im 67/133  bone]
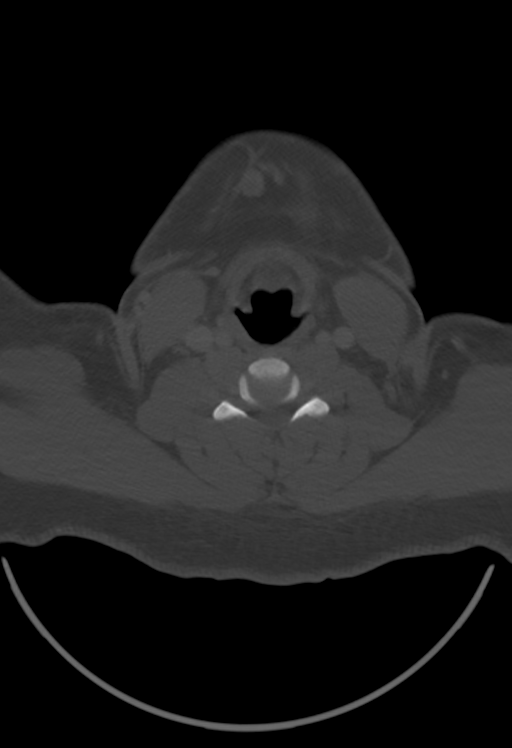
[im 100/133  bone]
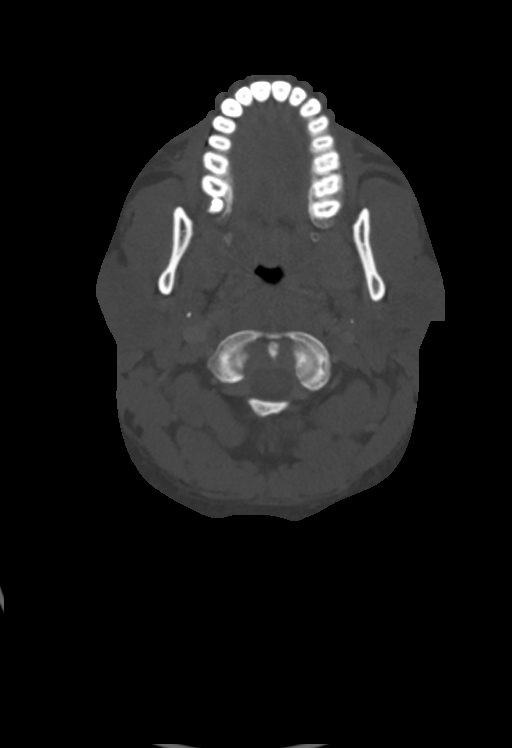

[14 of 33 positions shown; findings below may reference images not displayed]

RADIATION DOSE REDUCTION: This exam was performed according to the
departmental dose-optimization program which includes automated
exposure control, adjustment of the mA and/or kV according to
patient size and/or use of iterative reconstruction technique.

CONTRAST:  75mL OMNIPAQUE IOHEXOL 300 MG/ML  SOLN
FINDINGS: Pharynx and larynx: The nasal cavity is unremarkable. There is mild
prominence of the adenoid tonsils.

There is prominence of the palatine tonsils with a slightly
heterogeneous/high-grade appearance, with partial effacement of the
oropharyngeal airway. The oral cavity and oropharynx are otherwise
unremarkable. There is no evidence of organized abscess formation.

The hypopharynx and larynx are normal. The vocal folds are normal in
appearance, though are suboptimally assessed due to motion artifact.
The parapharyngeal spaces are clear. There is no retropharyngeal
fluid collection.

Salivary glands: The parotid and submandibular glands are
unremarkable.

Thyroid: Unremarkable.

Lymph nodes: There are prominent lymph nodes measuring up to 1.3 cm
on the right at level IIA, likely reactive.

Vascular: Major vessels of the neck are unremarkable.

Limited intracranial: The imaged intracranial compartment is
unremarkable.

Visualized orbits: The imaged globes and orbits are unremarkable.

Mastoids and visualized paranasal sinuses: The imaged paranasal
sinuses and mastoid air cells are clear.

Skeleton: The bones are unremarkable. There is no acute osseous
abnormality or aggressive osseous lesion.

Upper chest: The imaged lung apices are clear.

Other: None.
IMPRESSION: Prominence of the adenoid and palatine tonsils with heterogeneous
enhancement of the palatine tonsils suggesting tonsillitis. No
evidence of abscess formation. Prominent cervical chain lymph nodes
are likely reactive.

## 2022-12-08 ENCOUNTER — Encounter: Payer: Self-pay | Admitting: *Deleted

## 2022-12-08 ENCOUNTER — Other Ambulatory Visit: Payer: Self-pay

## 2022-12-08 ENCOUNTER — Emergency Department
Admission: EM | Admit: 2022-12-08 | Discharge: 2022-12-09 | Disposition: A | Discharge status: Home / Self Care | Payer: Self-pay | Attending: Emergency Medicine | Admitting: Emergency Medicine

## 2022-12-08 DIAGNOSIS — L03211 Cellulitis of face: Secondary | ICD-10-CM | POA: Insufficient documentation

## 2022-12-08 DIAGNOSIS — K1120 Sialoadenitis, unspecified: Secondary | ICD-10-CM | POA: Insufficient documentation

## 2022-12-08 LAB — BASIC METABOLIC PANEL
Anion gap: 9 (ref 5–15)
BUN: 11 mg/dL (ref 6–20)
CO2: 26 mmol/L (ref 22–32)
Calcium: 9 mg/dL (ref 8.9–10.3)
Chloride: 101 mmol/L (ref 98–111)
Creatinine, Ser: 0.95 mg/dL (ref 0.61–1.24)
GFR, Estimated: >60 mL/min (ref >60)
Glucose, Bld: 95 mg/dL (ref 70–99)
Potassium: 4 mmol/L (ref 3.5–5.1)
Sodium: 136 mmol/L (ref 135–145)

## 2022-12-08 LAB — CBC
HCT: 42.5 % (ref 39.0–52.0)
Hemoglobin: 13.7 g/dL (ref 13.0–17.0)
MCH: 28.7 pg (ref 26.0–34.0)
MCHC: 32.2 g/dL (ref 30.0–36.0)
MCV: 89.1 fL (ref 80.0–100.0)
Platelets: 388 10*3/uL (ref 150–400)
RBC: 4.77 MIL/uL (ref 4.22–5.81)
RDW: 14 % (ref 11.5–15.5)
WBC: 6 10*3/uL (ref 4.0–10.5)
nRBC: 0 % (ref 0.0–0.2)

## 2022-12-08 NOTE — ED Triage Notes (Signed)
Pt has insect bite beneath left eye.  Swelling noted to area with swelling to left side of face.  No resp distress.  Pt alert.

## 2022-12-08 NOTE — ED Provider Notes (Signed)
[facility] Provider Note    Event Date/Time   First MD Initiated Contact with Patient 12/08/22 2354     (approximate)   History   Insect Bite   HPI  James Sanchez is a 32 y.o. male who presents to the ED for evaluation of Insect Bite   Patient presents to the ED for evaluation of a possible insect bite to his maxillary face beneath his left eye.  Reports it happened yesterday and has had spreading redness, mild swelling and pain to that area since then.  No fevers or systemic symptoms.  Vision is at baseline.  Also reports for the past few days he has had a bump beneath his jaw.  No trauma, injuries.  No sore throat reports his breathing feels fine.  No trouble swallowing   Physical Exam   Triage Vital Signs: ED Triage Vitals  Enc Vitals Group     BP 12/08/22 2214 (!) 144/81     Pulse Rate 12/08/22 2214 63     Resp 12/08/22 2214 20     Temp 12/08/22 2214 98.5 F (36.9 C)     Temp Source 12/08/22 2214 Oral     SpO2 12/08/22 2214 96 %     Weight 12/08/22 2215 300 lb (136.1 kg)     Height 12/08/22 2215 5\' 8"  (1.727 m)     Head Circumference --      Peak Flow --      Pain Score 12/08/22 2219 8     Pain Loc --      Pain Edu? --      Excl. in [place]? --     Most recent vital signs: Vitals:   12/08/22 2214  BP: (!) 144/81  Pulse: 63  Resp: 20  Temp: 98.5 F (36.9 C)  SpO2: 96%    General: Awake, no distress.  CV:  Good peripheral perfusion.  Resp:  Normal effort.  Abd:  No distention.  MSK:  No deformity noted.  Neuro:  No focal deficits appreciated. Other:  Smooth palpable round mass to the right-sided submandibular soft tissues without overlying skin changes or signs of trauma.  Mandible is nontender and smooth throughout.  Resembles submandibular sialoadenitis  Separately, to his maxillary anterior cheek a couple centimeters inferior to his left eye he has a scabbing small lesion, possibly an insect bite, with a 2 cm diameter  surrounding area of erythema and mild induration.  No fluctuance or discharge.  No signs of EOM entrapment.   ED Results / Procedures / Treatments   Labs (all labs ordered are listed, but only abnormal results are displayed) Labs Reviewed  BASIC METABOLIC PANEL  CBC    EKG   RADIOLOGY   Official radiology report(s): No results found.  PROCEDURES and INTERVENTIONS:  Procedures  Medications  cephALEXin (KEFLEX) capsule 500 mg (500 mg Oral Given 12/09/22 0006)     IMPRESSION / MDM / ASSESSMENT AND PLAN / ED COURSE  I reviewed the triage vital signs and the nursing notes.  Differential diagnosis includes, but is not limited to, cellulitis, abscess, sepsis, sialoadenitis, upper airway obstruction, reactive lymphadenopathy  {Patient presents with symptoms of an acute illness or injury that is potentially life-threatening.  Pleasant 32 year old male presents to the ED with evidence of cellulitis from an insect bite and coexisting sial adenitis.  He looks well and has reassuring vital signs and I doubt sepsis.  Screening blood work with normal CBC and metabolic panel.  I start the  patient on Keflex to treat cellulitis and we discussed management of sialoadenitis.  We discussed return precautions and he is suitable for outpatient management  Clinical Course as of 12/09/22 0519  Tue Dec 09, 2022  0001 Submandibular sialoadenitis and left facial cellulitis [DS]    Clinical Course User Index [DS] Delton Prairie, MD     FINAL CLINICAL IMPRESSION(S) / ED DIAGNOSES   Final diagnoses:  Cellulitis, face  Submandibular sialoadenitis     Rx / DC Orders   ED Discharge Orders          Ordered    cephALEXin (KEFLEX) 500 MG capsule  3 times daily        12/09/22 0000             Note:  This document was prepared using Dragon voice recognition software and may include unintentional dictation errors.   Delton Prairie, MD 12/09/22 [phone]

## 2022-12-09 MED ORDER — CEPHALEXIN 500 MG PO CAPS
500.0000 mg | ORAL_CAPSULE | Freq: Once | ORAL | Status: AC
  Administered 2022-12-09: 500 mg via ORAL
  Filled 2022-12-09: qty 1

## 2022-12-09 MED ORDER — CEPHALEXIN 500 MG PO CAPS: 500.0000 mg | ORAL_CAPSULE | Freq: Three times a day (TID) | ORAL | 0 refills | Status: AC

## 2022-12-09 NOTE — Discharge Instructions (Addendum)
Please take Tylenol and ibuprofen/Advil for your pain.  It is safe to take them together, or to alternate them every few hours.  Take up to 1000mg  of Tylenol at a time, up to 4 times per day.  Do not take more than 4000 mg of Tylenol in 24 hours.  For ibuprofen, take 400-600 mg, 3 - 4 times per day.  Take the Keflex antibiotic 3 times daily for the next 7 days to treat infection/cellulitis.  Warm compresses can be helpful for both the saliva gland and infection of the face. Sour candies to help with the saliva gland  Return to the ED with any worsening symptoms despite these measures

## 2023-01-01 ENCOUNTER — Emergency Department: Payer: Self-pay

## 2023-01-01 ENCOUNTER — Encounter: Payer: Self-pay | Admitting: Emergency Medicine

## 2023-01-01 ENCOUNTER — Emergency Department
Admission: EM | Admit: 2023-01-01 | Discharge: 2023-01-01 | Disposition: A | Discharge status: Home / Self Care | Payer: Self-pay | Attending: Emergency Medicine | Admitting: Emergency Medicine

## 2023-01-01 ENCOUNTER — Other Ambulatory Visit: Payer: Self-pay

## 2023-01-01 DIAGNOSIS — J36 Peritonsillar abscess: Secondary | ICD-10-CM

## 2023-01-01 LAB — CBC WITH DIFFERENTIAL/PLATELET
Abs Immature Granulocytes: 0.04 10*3/uL (ref 0.00–0.07)
Basophils Absolute: 0 10*3/uL (ref 0.0–0.1)
Basophils Relative: 0 %
Eosinophils Absolute: 0 10*3/uL (ref 0.0–0.5)
Eosinophils Relative: 0 %
HCT: 44.7 % (ref 39.0–52.0)
Hemoglobin: 14.6 g/dL (ref 13.0–17.0)
Immature Granulocytes: 0 %
Lymphocytes Relative: 15 %
Lymphs Abs: 1.6 10*3/uL (ref 0.7–4.0)
MCH: 28.7 pg (ref 26.0–34.0)
MCHC: 32.7 g/dL (ref 30.0–36.0)
MCV: 87.8 fL (ref 80.0–100.0)
Monocytes Absolute: 1.1 10*3/uL — ABNORMAL HIGH (ref 0.1–1.0)
Monocytes Relative: 10 %
Neutro Abs: 8.2 10*3/uL — ABNORMAL HIGH (ref 1.7–7.7)
Neutrophils Relative %: 75 %
Platelets: 344 10*3/uL (ref 150–400)
RBC: 5.09 MIL/uL (ref 4.22–5.81)
RDW: 13.3 % (ref 11.5–15.5)
WBC: 11 10*3/uL — ABNORMAL HIGH (ref 4.0–10.5)
nRBC: 0 % (ref 0.0–0.2)

## 2023-01-01 LAB — BASIC METABOLIC PANEL
Anion gap: 11 (ref 5–15)
BUN: 11 mg/dL (ref 6–20)
CO2: 25 mmol/L (ref 22–32)
Calcium: 9.4 mg/dL (ref 8.9–10.3)
Chloride: 100 mmol/L (ref 98–111)
Creatinine, Ser: 1.04 mg/dL (ref 0.61–1.24)
GFR, Estimated: >60 mL/min (ref >60)
Glucose, Bld: 103 mg/dL — ABNORMAL HIGH (ref 70–99)
Potassium: 3.8 mmol/L (ref 3.5–5.1)
Sodium: 136 mmol/L (ref 135–145)

## 2023-01-01 LAB — GROUP A STREP BY PCR: Group A Strep by PCR: NOT DETECTED

## 2023-01-01 LAB — MONONUCLEOSIS SCREEN: Mono Screen: NEGATIVE

## 2023-01-01 MED ORDER — PREDNISONE 10 MG PO TABS: ORAL_TABLET | ORAL | 0 refills | Status: AC

## 2023-01-01 MED ORDER — DEXAMETHASONE SODIUM PHOSPHATE 10 MG/ML IJ SOLN
10.0000 mg | Freq: Once | INTRAMUSCULAR | Status: AC
  Administered 2023-01-01: 10 mg via INTRAVENOUS
  Filled 2023-01-01: qty 1

## 2023-01-01 MED ORDER — PREDNISONE 10 MG PO TABS: ORAL_TABLET | ORAL | 0 refills | Status: DC

## 2023-01-01 MED ORDER — IBUPROFEN 400 MG PO TABS
400.0000 mg | ORAL_TABLET | Freq: Once | ORAL | Status: AC
  Administered 2023-01-01: 400 mg via ORAL
  Filled 2023-01-01: qty 1

## 2023-01-01 MED ORDER — AMOXICILLIN-POT CLAVULANATE 875-125 MG PO TABS: 1.0000 | ORAL_TABLET | Freq: Two times a day (BID) | ORAL | 0 refills | Status: DC

## 2023-01-01 MED ORDER — SODIUM CHLORIDE 0.9 % IV SOLN
3.0000 g | Freq: Once | INTRAVENOUS | Status: AC
  Administered 2023-01-01: 3 g via INTRAVENOUS
  Filled 2023-01-01: qty 8

## 2023-01-01 MED ORDER — AMOXICILLIN-POT CLAVULANATE 875-125 MG PO TABS: 1.0000 | ORAL_TABLET | Freq: Two times a day (BID) | ORAL | 0 refills | Status: AC

## 2023-01-01 MED ORDER — LACTATED RINGERS IV BOLUS
1000.0000 mL | Freq: Once | INTRAVENOUS | Status: AC
  Administered 2023-01-01: 1000 mL via INTRAVENOUS

## 2023-01-01 MED ORDER — IOHEXOL 300 MG/ML  SOLN
100.0000 mL | Freq: Once | INTRAMUSCULAR | Status: AC | PRN
  Administered 2023-01-01: 80 mL via INTRAVENOUS

## 2023-01-01 NOTE — ED Triage Notes (Addendum)
Pt here with a sore throat that started yesterday. Pt has sore throat with right ear pain and a headache. Pt denies NVD. Pt states he has been running a fever at home. Pt last BM was 3 days ago. Pt states he has been taking tylenol and Theraflu.

## 2023-01-01 NOTE — ED Provider Notes (Signed)
[facility] Provider Note    Event Date/Time   First MD Initiated Contact with Patient 01/01/23 1126     (approximate)   History   Chief Complaint Sore Throat   HPI  James Sanchez is a 32 y.o. male with past medical history of cyclic vomiting and cannabis hyperemesis who presents to the ED complaining of sore throat.  Patient reports that he has been dealing with cough and congestion for the past couple of days, thought he had a cold until he woke up this morning with increased pain and swelling in the back of his throat.  He states that he can feel the swelling in his throat and it has made it difficult for him to swallow today.  He has been able to drink liquids but has been unable to tolerate solids, does frequently have to spit his oral secretions.  He has felt feverish at home but did not check his temperature, has not had any Tylenol or ibuprofen today.  He denies any difficulty breathing, is not aware of any sick contacts.     Physical Exam   Triage Vital Signs: ED Triage Vitals  Enc Vitals Group     BP 01/01/23 1102 (!) 129/94     Pulse Rate 01/01/23 1102 (!) 121     Resp 01/01/23 1102 20     Temp 01/01/23 1102 (!) 100.5 F (38.1 C)     Temp Source 01/01/23 1102 Oral     SpO2 01/01/23 1102 94 %     Weight 01/01/23 1103 299 lb 13.2 oz (136 kg)     Height 01/01/23 1103 5\' 8"  (1.727 m)     Head Circumference --      Peak Flow --      Pain Score 01/01/23 1103 10     Pain Loc --      Pain Edu? --      Excl. in GC? --     Most recent vital signs: Vitals:   01/01/23 1102 01/01/23 1341  BP: (!) 129/94 (!) 126/90  Pulse: (!) 121 98  Resp: 20 20  Temp: (!) 100.5 F (38.1 C)   SpO2: 94% 95%    Constitutional: Alert and oriented. Eyes: Conjunctivae are normal. Head: Atraumatic. Nose: No congestion/rhinnorhea. Mouth/Throat: Posterior oropharynx with unilateral edema and erythema on right with overlying exudates, edema extending into the  posterior hard palate.  Mucous membranes are moist.  Neck: No tenderness or edema noted. Cardiovascular: Tachycardic, regular rhythm. Grossly normal heart sounds.  2+ radial pulses bilaterally. Respiratory: Normal respiratory effort.  No retractions. Lungs CTAB. Gastrointestinal: Soft and nontender. No distention. Musculoskeletal: No lower extremity tenderness nor edema.  Neurologic:  Normal speech and language. No gross focal neurologic deficits are appreciated.    ED Results / Procedures / Treatments   Labs (all labs ordered are listed, but only abnormal results are displayed) Labs Reviewed  CBC WITH DIFFERENTIAL/PLATELET - Abnormal; Notable for the following components:      Result Value   WBC 11.0 (*)    Neutro Abs 8.2 (*)    Monocytes Absolute 1.1 (*)    All other components within normal limits  BASIC METABOLIC PANEL - Abnormal; Notable for the following components:   Glucose, Bld 103 (*)    All other components within normal limits  GROUP A STREP BY PCR  MONONUCLEOSIS SCREEN    RADIOLOGY CT soft tissue neck reviewed and interpreted by me with fluid collection noted in the right  tonsil concerning for abscess.  PROCEDURES:  Critical Care performed: No  Procedures   MEDICATIONS ORDERED IN ED: Medications  dexamethasone (DECADRON) injection 10 mg (10 mg Intravenous Given 01/01/23 1237)  ibuprofen (ADVIL) tablet 400 mg (400 mg Oral Given 01/01/23 1237)  lactated ringers bolus 1,000 mL (1,000 mLs Intravenous New Bag/Given 01/01/23 1236)  iohexol (OMNIPAQUE) 300 MG/ML solution 100 mL (80 mLs Intravenous Contrast Given 01/01/23 1304)  Ampicillin-Sulbactam (UNASYN) 3 g in sodium chloride 0.9 % 100 mL IVPB (3 g Intravenous New Bag/Given 01/01/23 1339)     IMPRESSION / MDM / ASSESSMENT AND PLAN / ED COURSE  I reviewed the triage vital signs and the nursing notes.                              32 y.o. male with past medical history of cyclic vomiting and cannabinoid  hyperemesis who presents to the ED complaining of sore throat and difficulty swallowing starting earlier this morning.  Patient's presentation is most consistent with acute presentation with potential threat to life or bodily function.  Differential diagnosis includes, but is not limited to, strep pharyngitis, viral pharyngitis, peritonsillar abscess, epiglottitis.  Patient nontoxic-appearing and in no acute distress, vital signs markable for fever and tachycardia, however he is well-appearing and I have low suspicion for sepsis.  He does have significant edema and erythema on the right side of his posterior oropharynx, edema extends into his hard palate.  Strep testing is negative, we will treat with IV Decadron and further assess with CT imaging.  Lab results and mono testing are pending at this time.  We will also treat fever with ibuprofen.  CT imaging shows tonsillar abscess on the right, relatively small in size at 1.8 cm x 1.1 cm.  Labs without significant anemia, leukocytosis, lecture abnormality, or AKI.  Findings reviewed with Dr. Andee Poles of ENT, who recommends against drainage as these types of tonsillar abscesses typically drain on their own.  He has arranged for follow-up tomorrow morning in the office, outpatient management seems reasonable as patient tolerating oral secretions without difficulty and vital signs improved on reassessment.  We will start him on prednisone taper and Augmentin, he was counseled to return to the ED for new or worsening symptoms.  Patient agrees with plan.      FINAL CLINICAL IMPRESSION(S) / ED DIAGNOSES   Final diagnoses:  Tonsillar abscess     Rx / DC Orders   ED Discharge Orders          Ordered    amoxicillin-clavulanate (AUGMENTIN) 875-125 MG tablet  2 times daily        01/01/23 1502    predniSONE (DELTASONE) 10 MG tablet  Daily        01/01/23 1502             Note:  This document was prepared using Dragon voice recognition software  and may include unintentional dictation errors.   Chesley Noon, MD 01/01/23 [phone]

## 2023-04-08 ENCOUNTER — Emergency Department
Admission: EM | Admit: 2023-04-08 | Discharge: 2023-04-09 | Disposition: A | Discharge status: Home / Self Care | Payer: Self-pay | Attending: Emergency Medicine | Admitting: Emergency Medicine

## 2023-04-08 ENCOUNTER — Encounter: Payer: Self-pay | Admitting: Emergency Medicine

## 2023-04-08 ENCOUNTER — Other Ambulatory Visit: Payer: Self-pay

## 2023-04-08 DIAGNOSIS — S01111A Laceration without foreign body of right eyelid and periocular area, initial encounter: Secondary | ICD-10-CM | POA: Insufficient documentation

## 2023-04-08 DIAGNOSIS — S0990XA Unspecified injury of head, initial encounter: Secondary | ICD-10-CM | POA: Insufficient documentation

## 2023-04-08 DIAGNOSIS — H5711 Ocular pain, right eye: Secondary | ICD-10-CM | POA: Insufficient documentation

## 2023-04-08 DIAGNOSIS — S01119A Laceration without foreign body of unspecified eyelid and periocular area, initial encounter: Secondary | ICD-10-CM

## 2023-04-08 DIAGNOSIS — W268XXA Contact with other sharp object(s), not elsewhere classified, initial encounter: Secondary | ICD-10-CM | POA: Insufficient documentation

## 2023-04-08 MED ORDER — ERYTHROMYCIN 5 MG/GM OP OINT: 1.0000 | TOPICAL_OINTMENT | Freq: Every day | OPHTHALMIC | 0 refills | Status: AC

## 2023-04-08 MED ORDER — ERYTHROMYCIN 5 MG/GM OP OINT
TOPICAL_OINTMENT | Freq: Once | OPHTHALMIC | Status: AC
  Filled 2023-04-08: qty 1

## 2023-04-08 MED ORDER — ACETAMINOPHEN 500 MG PO TABS
1000.0000 mg | ORAL_TABLET | Freq: Once | ORAL | Status: AC
  Administered 2023-04-08: 1000 mg via ORAL
  Filled 2023-04-08: qty 2

## 2023-04-08 MED ORDER — TETRACAINE HCL 0.5 % OP SOLN
1.0000 [drp] | Freq: Once | OPHTHALMIC | Status: AC
  Administered 2023-04-08: 1 [drp] via OPHTHALMIC
  Filled 2023-04-08: qty 4

## 2023-04-08 MED ORDER — FLUORESCEIN SODIUM 1 MG OP STRP
1.0000 | ORAL_STRIP | Freq: Once | OPHTHALMIC | Status: AC
  Administered 2023-04-08: 1 via OPHTHALMIC
  Filled 2023-04-08: qty 1
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
## 2023-06-24 ENCOUNTER — Ambulatory Visit
Admission: EM | Admit: 2023-06-24 | Discharge: 2023-06-24 | Disposition: A | Discharge status: Home / Self Care | Payer: Medicaid Other | Attending: Family Medicine | Admitting: Family Medicine

## 2023-06-24 DIAGNOSIS — J0391 Acute recurrent tonsillitis, unspecified: Secondary | ICD-10-CM | POA: Insufficient documentation

## 2023-06-24 LAB — GROUP A STREP BY PCR: Group A Strep by PCR: NOT DETECTED

## 2023-06-24 MED ORDER — AMOXICILLIN-POT CLAVULANATE 875-125 MG PO TABS: 1.0000 | ORAL_TABLET | Freq: Two times a day (BID) | ORAL | 0 refills | Status: DC

## 2023-06-24 MED ORDER — CEFTRIAXONE SODIUM 1 G IJ SOLR
1.0000 g | Freq: Once | INTRAMUSCULAR | Status: AC
  Administered 2023-06-24: 1 g via INTRAMUSCULAR

## 2023-06-24 NOTE — ED Provider Notes (Signed)
[facility]    CSN: [id number] Arrival date & time: 06/24/23  1111      History   Chief Complaint Chief Complaint  Patient presents with   Sore Throat    HPI James Sanchez is a 32 y.o. male.   HPI  History obtained from the patient. Kol presents for sore throat, body aches chills, and headaches that started yesterday. His cousin has been sick too and picked her up from the urgent care yesterday.  Has been taking Tylenol and throat medications but none today.  He has history of abscess on both sides of his throat and was treated with antibiotics and steroids.      Past Medical History:  Diagnosis Date   Cyclical vomiting with nausea    Strep throat     Patient Active Problem List   Diagnosis Date Noted   Abdominal pain 10/09/2018   Intractable nausea and vomiting 10/05/2018   Hypokalemia 06/05/2016   Intravascular volume depletion 06/05/2016   Cannabinoid hyperemesis syndrome 04/30/2016   High anion gap metabolic acidosis 04/29/2016   Chest pain, atypical 12/19/2013   Cyclical vomiting syndrome 05/08/2013   Persistent vomiting 04/27/2013   Anxiety state 09/13/2012   Psychosis (HCC) 09/13/2012   Marijuana abuse 08/13/2012   Cannabis abuse 08/13/2012    Past Surgical History:  Procedure Laterality Date   ESOPHAGOGASTRODUODENOSCOPY N/A 10/10/2018   Procedure: ESOPHAGOGASTRODUODENOSCOPY (EGD);  Surgeon: Toney Reil, MD;  Location: [facility] ENDOSCOPY;  Service: Gastroenterology;  Laterality: N/A;       Home Medications    Prior to Admission medications   Medication Sig Start Date End Date Taking? Authorizing Provider  amoxicillin-clavulanate (AUGMENTIN) 875-125 MG tablet Take 1 tablet by mouth every 12 (twelve) hours. 06/24/23  Yes Muriel Hannold, DO  acyclovir (ZOVIRAX) 800 MG tablet Take 1 tablet (800 mg total) by mouth 3 (three) times daily. Take 1 po TID for 2 days as needed. Patient not taking: Reported on 10/08/2021 01/25/20   Matt Holmes, PA  amitriptyline (ELAVIL) 50 MG tablet Take 1 tablet (50 mg total) by mouth at bedtime. Patient not taking: Reported on 10/08/2021 10/11/18   MayoAllyn Kenner, MD  fluticasone [facility]) 50 MCG/ACT nasal spray Place 1 spray into both nostrils daily. 01/20/22 02/19/22  Shaune Pollack, MD  hydrOXYzine (VISTARIL) 25 MG capsule Take 25 mg by mouth 3 (three) times daily. Patient not taking: Reported on 10/08/2021 10/20/18   [provider]  ibuprofen (ADVIL) 600 MG tablet Take 1 tablet (600 mg total) by mouth every 8 (eight) hours as needed for moderate pain. 01/20/22   Shaune Pollack, MD  ondansetron (ZOFRAN ODT) 4 MG disintegrating tablet Take 1 tablet (4 mg total) by mouth every 8 (eight) hours as needed. Patient not taking: Reported on 10/08/2021 10/01/18   Nita Sickle, MD  pantoprazole (PROTONIX) 40 MG tablet Take 1 tablet (40 mg total) by mouth 2 (two) times daily before a meal. Patient not taking: Reported on 10/08/2021 10/11/18   MayoAllyn Kenner, MD  Polyethylene Glycol 3350 (PEG 3350) POWD Take 17 g by mouth daily. Patient not taking: Reported on 10/08/2021 10/20/18   [provider]  promethazine (PHENERGAN) 25 MG tablet Take 1 tablet (25 mg total) by mouth every 6 (six) hours as needed for nausea, vomiting or refractory nausea / vomiting. Patient not taking: Reported on 10/08/2021 10/11/18   MayoAllyn Kenner, MD  senna-docusate (SENOKOT-S) 8.6-50 MG tablet Take 2 tablets by mouth Nightly. Patient not taking: Reported  on 10/08/2021 10/20/18   [provider]    Family History Family History  Problem Relation Age of Onset   Asthma Mother    Diabetes Maternal Grandmother    Diabetes Maternal Grandfather    Diabetes Paternal Grandmother    Diabetes Paternal Grandfather     Social History Social History   Tobacco Use   Smoking status: Former   Smokeless tobacco: Never  Advertising account planner   Vaping status: Never Used  Substance Use Topics   Alcohol use: Not  Currently    Comment: occasionally   Drug use: Not Currently    Types: Marijuana    Comment: last used 4 years     Allergies   Aspirin and Sulfa antibiotics   Review of Systems Review of Systems: negative unless otherwise stated in HPI.      Physical Exam Triage Vital Signs ED Triage Vitals  Encounter Vitals Group     BP 06/24/23 1220 137/86     Systolic BP Percentile --      Diastolic BP Percentile --      Pulse Rate 06/24/23 1220 88     Resp 06/24/23 1220 17     Temp 06/24/23 1220 98.6 F (37 C)     Temp Source 06/24/23 1220 Oral     SpO2 06/24/23 1220 100 %     Weight --      Height --      Head Circumference --      Peak Flow --      Pain Score 06/24/23 1218 9     Pain Loc --      Pain Education --      Exclude from Growth Chart --    No data found.  Updated Vital Signs BP 137/86 (BP Location: Left Arm)   Pulse 88   Temp 98.6 F (37 C) (Oral)   Resp 17   SpO2 100%   Visual Acuity Right Eye Distance:   Left Eye Distance:   Bilateral Distance:    Right Eye Near:   Left Eye Near:    Bilateral Near:     Physical Exam GEN:     alert, non-toxic appearing male in no distress    HENT:  mucus membranes moist, oropharyngeal with erythema, kissing tonsillar hypertrophy with white and green exudates,  moderate erythematous edematous turbinates, clear nasal discharge EYES:   pupils equal and reactive, no scleral injection or discharge NECK:  normal ROM, no lymphadenopathy, no meningismus   RESP:  no increased work of breathing, clear to auscultation bilaterally CVS:   regular rate and rhythm Skin:   warm and dry, no rash on visible skin    UC Treatments / Results  Labs (all labs ordered are listed, but only abnormal results are displayed) Labs Reviewed  GROUP A STREP BY PCR    EKG   Radiology No results found.  Procedures Procedures (including critical care time)  Medications Ordered in UC Medications  cefTRIAXone (ROCEPHIN) injection 1 g  (1 g Intramuscular Given 06/24/23 1343)    Initial Impression / Assessment and Plan / UC Course  I have reviewed the triage vital signs and the nursing notes.  Pertinent labs & imaging results that were available during my care of the patient were reviewed by me and considered in my medical decision making (see chart for details).       Pt is a 32 y.o. male who presents for sore throat days of respiratory symptoms. [place] is afebrile here.  Satting well on room air.   On chart review, pt see at [facility] ED on 10 to seen 2023 and diagnosed with peritonsillar abscess that was drained at the bedside. Fever was documented at 104.2 F at that time.  He was seen by Dr. Joretta Bachelor, ENT, on 06/12/2023.  He has history of recurrent peritonsillar abscesses and tonsil stones.  A tonsillectomy was discussed at that visit but patient declined.  Satting well on room air. Overall pt is ill but non-toxic appearing, well hydrated, without respiratory distress.  He is satting well on room air.  Strep PCR is negative.  On exam, he has erythematous kissing tonsils with exudates green and white.  Recommended ED evaluation as I am not able to rule out a peritonsillar abscess here.Marland Kitchen  He is of note afebrile without recent antipyretics.  It is reasonable to see if we can resolve this outpatient.  Gave ceftriaxone 1 g IM and prescription for Augmentin.  If he has not improved within 24 hours or has any red flag symptoms, he will go to the emergency department.  In the meantime, he should contact his ear nose and throat physician.  Return and ED precautions given and voiced understanding. Discussed MDM, treatment plan and plan for follow-up with patient who agrees with plan.     Final Clinical Impressions(s) / UC Diagnoses   Final diagnoses:  Recurrent tonsillitis     Discharge Instructions      Stop by the pharmacy to pick up your prescription.  If you throat pain worsens, have fever, or you have difficulty  breathing, go to the emergency department/ER.   Call your ear, nose and throat doctor to discuss getting your tonsils removed.      ED Prescriptions     Medication Sig Dispense Auth. Provider   amoxicillin-clavulanate (AUGMENTIN) 875-125 MG tablet Take 1 tablet by mouth every 12 (twelve) hours. 14 tablet Dontai Pember, Seward Meth, DO      PDMP not reviewed this encounter.   Katha Cabal, DO 06/24/23 1416

## 2023-06-24 NOTE — ED Triage Notes (Addendum)
Sore throat since yesterday. Unsure of fever he's taking tylenol. Hx of tonsil abscess. Headache.

## 2023-06-24 NOTE — Discharge Instructions (Addendum)
Stop by the pharmacy to pick up your prescription.  If you throat pain worsens, have fever, or you have difficulty breathing, go to the emergency department/ER.   Call your ear, nose and throat doctor to discuss getting your tonsils removed.

## 2023-07-14 ENCOUNTER — Ambulatory Visit: Payer: Medicaid Other

## 2023-12-21 ENCOUNTER — Encounter: Payer: Self-pay | Admitting: Nurse Practitioner

## 2023-12-21 ENCOUNTER — Ambulatory Visit: Admitting: Nurse Practitioner

## 2023-12-21 DIAGNOSIS — Z113 Encounter for screening for infections with a predominantly sexual mode of transmission: Secondary | ICD-10-CM | POA: Diagnosis not present

## 2023-12-21 DIAGNOSIS — N341 Nonspecific urethritis: Secondary | ICD-10-CM

## 2023-12-21 LAB — GRAM STAIN

## 2023-12-21 MED ORDER — DOXYCYCLINE HYCLATE 100 MG PO TABS: 100.0000 mg | ORAL_TABLET | Freq: Two times a day (BID) | ORAL | 0 refills | Status: AC

## 2023-12-21 NOTE — Progress Notes (Signed)
 Pt here for STI screening.  Gram stain results reviewed with patient.  Results positive for NGU.  Doxycycline 100mg  #14 dispensed to patient.  Counseled on medication, side effects, plan of care and when to contact clinic with questions or concerns.  Contact card x 1.  NGU pamphlet given.  Condoms declined.-Rhen Dossantos, RN

## 2023-12-21 NOTE — Progress Notes (Signed)
 [facility] Department STI clinic [address], [address] [place] [address] Main phone: [phone]  STI screening visit  Subjective:  Marcellous Veltman is a 33 y.o. male being seen today for an STI screening visit. The patient reports they do have symptoms.    Patient has the following medical conditions:  Patient Active Problem List   Diagnosis Date Noted   Abdominal pain 10/09/2018   Intractable nausea and vomiting 10/05/2018   Hypokalemia 06/05/2016   Intravascular volume depletion 06/05/2016   Cannabinoid hyperemesis syndrome 04/30/2016   High anion gap metabolic acidosis 04/29/2016   Chest pain, atypical 12/19/2013   Cyclical vomiting syndrome 05/08/2013   Persistent vomiting 04/27/2013   Anxiety state 09/13/2012   Psychosis (HCC) 09/13/2012   Marijuana abuse 08/13/2012   Cannabis abuse 08/13/2012   Chief Complaint  Patient presents with   SEXUALLY TRANSMITTED DISEASE    HPI Patient is a pleasant 33 y.o. male who presents to the office today requesting symptomatic STI testing.  Patient indicated symptoms include genital itching to the inner thighs for 1 week, dysuria with increased urine frequency, feeling of inability to empty bladder, and back pain for 2 weeks. Patient denies fever, chills, body aches, and n/v. He reports 1 male partner in the last 2 months, and practices penile/vaginal penetrative sex and oral sex. Patient reports using condoms sometimes. Patient indicates a history of trich, HSV, gonorrhea/chlamydia most recently in fall of 2024. He reports last sex was about 3 weeks ago.    STI screening history: Last HIV test per patient/review of record was  Lab Results  Component Value Date   HMHIVSCREEN Negative - Validated 10/08/2021    Lab Results  Component Value Date   HIV Non Reactive 10/06/2018    Last HEPC test per patient/review of record was No results found for: "HMHEPCSCREEN" No components found for: "HEPC"   Last  HEPB test per patient/review of record was No components found for: "HMHEPBSCREEN"   Fertility: Does the patient or their partner desires a pregnancy in the next year? No  Screening for MPX risk: Does the patient have an unexplained rash? No Is the patient MSM? No Does the patient endorse multiple sex partners or anonymous sex partners? No Did the patient have close or sexual contact with a person diagnosed with MPX? No Has the patient traveled outside the [place]  where MPX is endemic? No Is there a high clinical suspicion for MPX-- evidenced by one of the following No  -Unlikely to be chickenpox  -Lymphadenopathy  -Rash that present in same phase of evolution on any given body part  See flowsheet for further details and programmatic requirements  Hyperlink available at the top of the signed note in blue.  Flow sheet content below:  Pregnancy Intention Screening Does the patient want to become pregnant in the next year?: No Does the patient's partner want to become pregnant in the next year?: No Would the patient like to discuss contraceptive options today?: N/A All Patients Anyone smoke around pt and/or pt's children?: Yes Anyone smoke inside pt's house?: Yes Anyone smoke inside car?: Yes Anyone smoke inside the workplace?: No Reason For STD Screen STD Screening: Has symptoms Have you ever had an STD?: Yes History of Antibiotic use in the past 2 weeks?: No STD Symptoms Denies all: No Genital Itching: Yes Genital Itch s/s: Started 1 week ago Lower abdominal pain: No Discharge: No Dysuria: Yes Dysuria s/s: 2 weeks, getting worse, increased frequently, back pain, no fever,  no n/v, no bodyaches, no chills Genital ulcer / lesion: No Rash: No Oral / Other skin ulcer: No Pain with sex: No Sore Throat: No Visual Changes: No Other (Describe in Comments): No Risk Factors for Hep B Household, sexual, or needle sharing contact of a person infected with Hep B: No Sexual contact with a  person who uses drugs not as prescribed?: No Currently or Ever used drugs not as prescribed: No HIV Positive: No PRep Patient: No Men who have sex with men: No Have Hepatitis C: No History of Incarceration: Yes History of Homeslessness?: No Anal sex following anal drug use?: No Risk Factors for Hep C Currently using drugs not as prescribed: No Sexual partner(s) currently using drugs as not prescribed: No History of drug use: Yes HIV Positive: No People with a history of incarceration: Yes People born between the years of 10 and 50: No Hepatitis Counseling Hep B Counseling: Counseled patient about increased risk of Hep B and recommendation for testing, Patient declines testing for Hep B today Hep C Counseling: Counseled patient about increased risk of Hep C and recommendation for testing, Patient declines testing for Hep C today Abuse History Has patient ever been abused physically?: No Has patient ever been abused sexually?: No Does patient feel they have a problem with Anxiety?: No Does patient feel they have a problem with Depression?: No Referral to Behavioral Health: No Counseling Patient counseled to use condoms with all sex: Condoms declined RTC in 2-3 weeks for test results: Yes Clinic will call if test results abnormal before test result appt.: Yes Test results given to patient Patient counseled to use condoms with all sex: Condoms declined Contraception Wrap Up Current Method: Male Condom End Method: Male Condom Contraception Counseling Provided: Yes How was the end contraceptive method provided?: N/A (Declined)  Immunization History  Administered Date(s) Administered   Influenza,inj,Quad PF,6+ Mos 09/09/2012, 05/08/2013, 10/06/2018   Influenza-Unspecified 10/13/2018   Tdap 04/29/2011    The following portions of the patient's history were reviewed and updated as appropriate: allergies, current medications, past medical history, past social history, past  surgical history and problem list.  Objective:  There were no vitals filed for this visit.  Physical Exam Nursing note reviewed. Chaperone present: Declined.  Constitutional:      Appearance: Normal appearance.  HENT:     Head: Normocephalic.     Salivary Glands: Right salivary gland is not diffusely enlarged or tender. Left salivary gland is not diffusely enlarged or tender.     Mouth/Throat:     Lips: Pink. No lesions.     Mouth: Mucous membranes are moist. No oral lesions.     Tongue: No lesions. Tongue does not deviate from midline.     Pharynx: Oropharynx is clear. Uvula midline. No oropharyngeal exudate or posterior oropharyngeal erythema.     Tonsils: No tonsillar exudate.  Eyes:     General:        Right eye: No discharge.        Left eye: No discharge.     Conjunctiva/sclera:     Right eye: Right conjunctiva is not injected. No exudate.    Left eye: Left conjunctiva is not injected. No exudate.    Comments: Glasses in place.   Pulmonary:     Effort: Pulmonary effort is normal.  Genitourinary:    Pubic Area: No rash or pubic lice.      Penis: Normal and circumcised. No tenderness, discharge, swelling or lesions.  Testes: Normal.     Epididymis:     Right: Normal. No mass or tenderness.     Left: Normal. No mass or tenderness.     Tanner stage (genital): 5.     Comments: No discharge present. Patient requested penile swab.  Lymphadenopathy:     Head:     Right side of head: No submental, submandibular, tonsillar, preauricular or posterior auricular adenopathy.     Left side of head: No submental, submandibular, tonsillar, preauricular or posterior auricular adenopathy.     Cervical: No cervical adenopathy.     Right cervical: No superficial or posterior cervical adenopathy.    Left cervical: No superficial or posterior cervical adenopathy.     Upper Body:     Right upper body: No supraclavicular or axillary adenopathy.     Left upper body: No supraclavicular  or axillary adenopathy.     Lower Body: No right inguinal adenopathy. No left inguinal adenopathy.  Skin:    General: Skin is warm and dry.     Findings: No lesion or rash.     Comments: Skin tone appropriate for ethnicity.   Neurological:     Mental Status: He is alert and oriented to person, place, and time.  Psychiatric:        Attention and Perception: Attention and perception normal.        Mood and Affect: Mood and affect normal.        Speech: Speech normal.        Behavior: Behavior normal. Behavior is cooperative.        Thought Content: Thought content normal.     Assessment and Plan:  Tyrei Vasiliou is a 33 y.o. male presenting to the [facility] Department for STI screening  1. Screening for venereal disease (Primary) Patient declines serum blood labs.  - Gonococcus culture - Chlamydia/GC NAA, Confirmation - Gram stain   Patient does have STI symptoms Patient only accepted screenings including  urine GC/Chlamydia. Patient meets criteria for HepB screening? Yes. Ordered? No-Declined Patient meets criteria for HepC screening? Yes. Ordered? No-Declined Recommended condom use with all sex Discussed importance of condom use for STI prevention  Treat positive test results per standing order. Discussed time line for State [facility] results and that patient will be called with positive results and encouraged patient to call if he had not heard in 2 weeks Recommended repeat testing in 3 months with positive results. Recommended returning for continued or worsening symptoms.   Return if symptoms worsen or fail to improve.  No future appointments.  Total time with patient 20 minutes.   Merleen Stare, NP

## 2023-12-21 NOTE — Addendum Note (Signed)
 Addended by: Tabitha Riggins L on: 12/21/2023 10:02 AM   Modules accepted: Orders

## 2023-12-23 LAB — CHLAMYDIA/GC NAA, CONFIRMATION
Chlamydia trachomatis, NAA: NEGATIVE
Neisseria gonorrhoeae, NAA: NEGATIVE

## 2023-12-26 LAB — GONOCOCCUS CULTURE

## 2024-02-16 ENCOUNTER — Emergency Department

## 2024-02-16 ENCOUNTER — Other Ambulatory Visit: Payer: Self-pay

## 2024-02-16 ENCOUNTER — Emergency Department
Admission: EM | Admit: 2024-02-16 | Discharge: 2024-02-16 | Disposition: A | Discharge status: Home / Self Care | Attending: Emergency Medicine | Admitting: Emergency Medicine

## 2024-02-16 DIAGNOSIS — R0789 Other chest pain: Secondary | ICD-10-CM | POA: Insufficient documentation

## 2024-02-16 LAB — CBC
HCT: 39.4 % (ref 39.0–52.0)
Hemoglobin: 12.9 g/dL — ABNORMAL LOW (ref 13.0–17.0)
MCH: 28.3 pg (ref 26.0–34.0)
MCHC: 32.7 g/dL (ref 30.0–36.0)
MCV: 86.4 fL (ref 80.0–100.0)
Platelets: 392 K/uL (ref 150–400)
RBC: 4.56 MIL/uL (ref 4.22–5.81)
RDW: 12.9 % (ref 11.5–15.5)
WBC: 5.6 K/uL (ref 4.0–10.5)
nRBC: 0 % (ref 0.0–0.2)

## 2024-02-16 LAB — BASIC METABOLIC PANEL WITH GFR
Anion gap: 8 (ref 5–15)
BUN: 11 mg/dL (ref 6–20)
CO2: 27 mmol/L (ref 22–32)
Calcium: 9.3 mg/dL (ref 8.9–10.3)
Chloride: 105 mmol/L (ref 98–111)
Creatinine, Ser: 0.93 mg/dL (ref 0.61–1.24)
GFR, Estimated: >60 mL/min (ref >60)
Glucose, Bld: 103 mg/dL — ABNORMAL HIGH (ref 70–99)
Potassium: 3.9 mmol/L (ref 3.5–5.1)
Sodium: 140 mmol/L (ref 135–145)

## 2024-02-16 LAB — HEPATIC FUNCTION PANEL
ALT: 26 U/L (ref 0–44)
AST: 31 U/L (ref 15–41)
Albumin: 3.7 g/dL (ref 3.5–5.0)
Alkaline Phosphatase: 52 U/L (ref 38–126)
Bilirubin, Direct: 0.1 mg/dL (ref 0.0–0.2)
Indirect Bilirubin: 0.4 mg/dL (ref 0.3–0.9)
Total Bilirubin: 0.5 mg/dL (ref 0.0–1.2)
Total Protein: 7.7 g/dL (ref 6.5–8.1)

## 2024-02-16 LAB — LIPASE, BLOOD: Lipase: 32 U/L (ref 11–51)

## 2024-02-16 LAB — TROPONIN I (HIGH SENSITIVITY)
Troponin I (High Sensitivity): 4 ng/L (ref <18)
Troponin I (High Sensitivity): 2 ng/L (ref <18)

## 2024-02-16 MED ORDER — ALUM & MAG HYDROXIDE-SIMETH 200-200-20 MG/5ML PO SUSP
30.0000 mL | Freq: Once | ORAL | Status: AC
  Administered 2024-02-16: 30 mL via ORAL
  Filled 2024-02-16: qty 30

## 2024-02-16 NOTE — ED Notes (Signed)
 Blue top sent to lab.

## 2024-02-16 NOTE — Discharge Instructions (Signed)
 You have been seen in the Emergency Department (ED) today for chest pain.  As we have discussed today's test results are normal, and we believe your pain is due to pain/strain and/or inflammation of the muscles and/or cartilage of your chest wall.  We recommend you take ibuprofen  600 mg three times a day with meals for the next 5 days (unless you have been told previously not to take ibuprofen  or NSAIDs in general).  You may also take Tylenol  according to the label instructions.  Read through the included information for additional treatment recommendations and precautions.  We also recommend you consider taking a medication such as famotidine , which is available over-the-counter.  This can help with the acid reflux symptoms you are describing  Continue to take your regular medications.   Return to the Emergency Department (ED) if you experience any further chest pain/pressure/tightness, difficulty breathing, or sudden sweating, or other symptoms that concern you.

## 2024-02-16 NOTE — ED Triage Notes (Signed)
 Pt to ED via POV from work c/o left sided CP started about 30 mins ago. 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 Radiates to left arm and back. Denies SOB, fevers, dizziness

## 2024-02-16 NOTE — ED Provider Notes (Signed)
 [facility] Provider Note    Event Date/Time   First MD Initiated Contact with Patient 02/16/24 [phone]     (approximate)   History   Chest Pain   HPI James Sanchez is a 33 y.o. male whose medical history includes atypical chest pain, cyclic vomiting syndrome, cannabis abuse, and anxiety.  He presents from work for evaluation of left-sided chest pain.  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  It was sharp and scared him.  It happened again a couple more times in a short period so he decided to come to the emergency department.  He said he feels better now.  He can only feel it if he moves his left arm or lies in a certain position in the bed.  He denies shortness of breath, nausea, vomiting, sweating, abdominal pain.  No recent trauma or injury of which he is aware.  No lightheadedness nor dizziness.  Of note he says that for the last couple of days he has felt a bit gassy and that he has some discomfort in his upper abdomen and the left side of his chest and then he feels better after he burps.  He does not take medicine for acid reflux.     Physical Exam   Triage Vital Signs: ED Triage Vitals  Encounter Vitals Group     BP 02/16/24 0556 139/60     Girls Systolic BP Percentile --      Girls Diastolic BP Percentile --      Boys Systolic BP Percentile --      Boys Diastolic BP Percentile --      Pulse Rate 02/16/24 0556 85     Resp 02/16/24 0556 18     Temp 02/16/24 0556 98.1 F (36.7 C)     Temp src --      SpO2 02/16/24 0556 97 %     Weight 02/16/24 0554 133.8 kg (295 lb)     Height 02/16/24 0554 1.727 m (5' 8)     Head Circumference --      Peak Flow --      Pain Score 02/16/24 0553 10     Pain Loc --      Pain Education --      Exclude from Growth Chart --     Most recent vital signs: Vitals:   02/16/24 0800 02/16/24 0900  BP: (!) 107/52  115/67  Pulse: 69 74  Resp:    Temp:    SpO2: 98% 94%    General: Awake, no distress.  CV:  Good peripheral perfusion.  Normal heart sounds, regular rate and rhythm. Resp:  Normal effort. Speaking easily and comfortably, no accessory muscle usage nor intercostal retractions.  Lungs are clear to auscultation bilaterally. Abd:  No distention. Obese.  No tenderness to palpation of the epigastrium nor right upper quadrant.    ED Results / Procedures / Treatments   Labs (all labs ordered are listed, but only abnormal results are displayed) Labs Reviewed  BASIC METABOLIC PANEL WITH GFR - Abnormal; Notable for the following components:      Result Value   Glucose, Bld 103 (*)    All other components within normal limits  CBC - Abnormal; Notable for the following components:   Hemoglobin 12.9 (*)    All other components within normal limits  HEPATIC  FUNCTION PANEL  LIPASE, BLOOD  TROPONIN I (HIGH SENSITIVITY)  TROPONIN I (HIGH SENSITIVITY)     EKG  ED ECG REPORT I, Darleene Dome, the attending physician, personally viewed and interpreted this ECG.  Date: 02/16/2024 EKG Time: 05:54 Rate: 102 Rhythm: borderline sinus tachycardia QRS Axis: normal Intervals: normal ST/T Wave abnormalities: Non-specific ST segment / T-wave changes, but no clear evidence of acute ischemia. Narrative Interpretation: no definitive evidence of acute ischemia; does not meet STEMI criteria.    RADIOLOGY I independently viewed and interpreted the patient's 1 view chest x-ray.  I see no evidence of pneumonia or widened mediastinum, or pneumothorax.  I also read the radiologist's report, which confirmed no acute findings.   PROCEDURES:  Critical Care performed: No  Procedures    IMPRESSION / MDM / ASSESSMENT AND PLAN / ED COURSE  I reviewed the triage vital signs and the nursing notes.                              Differential diagnosis includes, but is not limited to, musculoskeletal  strain, acid reflux, biliary colic, ACS, PE, pneumonia, pneumothorax.  Patient's presentation is most consistent with acute presentation with potential threat to life or bodily function.  Labs/studies ordered: EKG, chest x-ray, BMP, CBC, high-sensitivity troponin x 2, lipase, hepatic function panel  Interventions/Medications given:  Medications  alum & mag hydroxide-simeth (MAALOX/MYLANTA) 200-200-20 MG/5ML suspension 30 mL (30 mLs Oral Given 02/16/24 0803)    (Note:  hospital course my include additional interventions and/or labs/studies not listed above.)   PERC negative, low risk for ACS based on HEAR score.  Reproducible left-sided chest pain with palpation of the left upper chest and movement of his left arm.  Vital signs reassuring, no ischemia on EKG, normal chest x-ray, initial labs are normal.  I added on hepatic function panel and lipase and will get a second troponin, but I anticipate discharge with outpatient follow-up given his very low risk of acute cause of pain.  Patient understands and agrees with the plan.     Clinical Course as of 02/16/24 0926  Tue Feb 16, 2024  0925 Patient sleeping comfortably, repeat troponin is negative.  Will discharge for outpatient follow-up.  Patient agrees with the plan [CF]    Clinical Course User Index [CF] Dome Darleene, MD     FINAL CLINICAL IMPRESSION(S) / ED DIAGNOSES   Final diagnoses:  Musculoskeletal chest pain     Rx / DC Orders   ED Discharge Orders          Ordered    Ambulatory Referral to Primary Care (Establish Care)        02/16/24 0925             Note:  This document was prepared using Dragon voice recognition software and may include unintentional dictation errors.   Dome Darleene, MD 02/16/24 [phone]

## 2024-04-25 ENCOUNTER — Ambulatory Visit
Admission: EM | Admit: 2024-04-25 | Discharge: 2024-04-25 | Disposition: A | Discharge status: Home / Self Care | Attending: Emergency Medicine | Admitting: Emergency Medicine

## 2024-04-25 DIAGNOSIS — M545 Low back pain, unspecified: Secondary | ICD-10-CM | POA: Diagnosis not present

## 2024-04-25 MED ORDER — KETOROLAC TROMETHAMINE 30 MG/ML IJ SOLN
30.0000 mg | Freq: Once | INTRAMUSCULAR | Status: AC
  Administered 2024-04-25: 30 mg via INTRAMUSCULAR

## 2024-04-25 MED ORDER — IBUPROFEN 600 MG PO TABS: 600.0000 mg | ORAL_TABLET | Freq: Four times a day (QID) | ORAL | 0 refills | Status: AC | PRN

## 2024-04-25 MED ORDER — BACLOFEN 10 MG PO TABS: 10.0000 mg | ORAL_TABLET | Freq: Three times a day (TID) | ORAL | 0 refills | Status: AC

## 2024-04-25 NOTE — ED Triage Notes (Signed)
 Patient states that he woke up this morning with right lower back pain that radiates dow to his hip.

## 2024-04-25 NOTE — ED Provider Notes (Signed)
 [facility]    CSN: [id number] Arrival date & time: 04/25/24  1039      History   Chief Complaint Chief Complaint  Patient presents with   Back Pain    HPI James Sanchez is a 33 y.o. male.   HPI  33 year old male with past medical history significant for marijuana use with associated cannabinoid hyperemesis syndrome and cyclic vomiting syndrome presents for evaluation of right-sided low back pain that radiates down to his right hip.  He reports that he noticed it this morning when he woke up.  He also reports that last night he had difficulty getting comfortable.  He denies any known injury or falls.  He reports that approximately 2 weeks ago he did lift a heavy garbage can at work and he is unsure if that may have contributed to the pain.  He cannot remember anything recent.  Past Medical History:  Diagnosis Date   Cyclical vomiting with nausea    Strep throat     Patient Active Problem List   Diagnosis Date Noted   Abdominal pain 10/09/2018   Intractable nausea and vomiting 10/05/2018   Hypokalemia 06/05/2016   Intravascular volume depletion 06/05/2016   Cannabinoid hyperemesis syndrome 04/30/2016   High anion gap metabolic acidosis 04/29/2016   Chest pain, atypical 12/19/2013   Cyclical vomiting syndrome 05/08/2013   Persistent vomiting 04/27/2013   Anxiety state 09/13/2012   Psychosis (HCC) 09/13/2012   Marijuana abuse 08/13/2012   Cannabis abuse 08/13/2012    Past Surgical History:  Procedure Laterality Date   ESOPHAGOGASTRODUODENOSCOPY N/A 10/10/2018   Procedure: ESOPHAGOGASTRODUODENOSCOPY (EGD);  Surgeon: Unk Corinn Skiff, MD;  Location: [facility] ENDOSCOPY;  Service: Gastroenterology;  Laterality: N/A;       Home Medications    Prior to Admission medications   Medication Sig Start Date End Date Taking? Authorizing Provider  baclofen  (LIORESAL ) 10 MG tablet Take 1 tablet (10 mg total) by mouth 3 (three) times daily. 04/25/24  Yes Bernardino Ditch,  NP  ibuprofen  (ADVIL ) 600 MG tablet Take 1 tablet (600 mg total) by mouth every 6 (six) hours as needed. 04/25/24  Yes Bernardino Ditch, NP  fluticasone  (FLONASE ) 50 MCG/ACT nasal spray Place 1 spray into both nostrils daily. 01/20/22 02/19/22  Angelena Smalls, MD    Family History Family History  Problem Relation Age of Onset   Asthma Mother    Diabetes Maternal Grandmother    Diabetes Maternal Grandfather    Diabetes Paternal Grandmother    Diabetes Paternal Grandfather     Social History Social History   Tobacco Use   Smoking status: Former   Smokeless tobacco: Never  Advertising account planner   Vaping status: Never Used  Substance Use Topics   Alcohol use: Not Currently    Comment: occasionally   Drug use: Not Currently    Types: Marijuana    Comment: last used 4 years     Allergies   Aspirin and Sulfa antibiotics   Review of Systems Review of Systems  Musculoskeletal:  Positive for back pain.     Physical Exam Triage Vital Signs ED Triage Vitals  Encounter Vitals Group     BP 04/25/24 1118 128/77     Girls Systolic BP Percentile --      Girls Diastolic BP Percentile --      Boys Systolic BP Percentile --      Boys Diastolic BP Percentile --      Pulse Rate 04/25/24 1118 77     Resp 04/25/24  1118 18     Temp 04/25/24 1118 98.5 F (36.9 C)     Temp Source 04/25/24 1118 Oral     SpO2 04/25/24 1118 97 %     Weight 04/25/24 1117 297 lb (134.7 kg)     Height --      Head Circumference --      Peak Flow --      Pain Score 04/25/24 1117 10     Pain Loc --      Pain Education --      Exclude from Growth Chart --    No data found.  Updated Vital Signs BP 128/77 (BP Location: Right Arm)   Pulse 77   Temp 98.5 F (36.9 C) (Oral)   Resp 18   Wt 297 lb (134.7 kg)   SpO2 97%   BMI 45.16 kg/m   Visual Acuity Right Eye Distance:   Left Eye Distance:   Bilateral Distance:    Right Eye Near:   Left Eye Near:    Bilateral Near:     Physical Exam Vitals and nursing  note reviewed.  Constitutional:      Appearance: Normal appearance. He is not ill-appearing.  HENT:     Head: Normocephalic and atraumatic.  Musculoskeletal:        General: Tenderness present. No swelling or signs of injury.  Skin:    General: Skin is warm and dry.     Capillary Refill: Capillary refill takes less than 2 seconds.     Findings: No bruising or erythema.  Neurological:     General: No focal deficit present.     Mental Status: He is alert and oriented to person, place, and time.      UC Treatments / Results  Labs (all labs ordered are listed, but only abnormal results are displayed) Labs Reviewed - No data to display  EKG   Radiology No results found.  Procedures Procedures (including critical care time)  Medications Ordered in UC Medications  ketorolac  (TORADOL ) 30 MG/ML injection 30 mg (has no administration in time range)    Initial Impression / Assessment and Plan / UC Course  I have reviewed the triage vital signs and the nursing notes.  Pertinent labs & imaging results that were available during my care of the patient were reviewed by me and considered in my medical decision making (see chart for details).   Patient is a nontoxic-appearing 33 year old male presenting for evaluation of right-sided low back pain.  On exam he has no midline spinous process tenderness or step-off in his lumbar spine.  The right lower lumbar paraspinous region is tender to palpation.  No appreciable spasm noted.  The pain does track along the path of the sciatic nerve through the right buttock.  He has a negative straight leg raise bilaterally.  He has not take anything for his pain.  He reports that he missed work last night and this morning as he works 2 jobs.  I will have staff administer 30 mg of IM Toradol  here in clinic and then discharge him home on ibuprofen  and baclofen  to help with inflammation and muscle pain.  We discussed home physical therapy exercises as well.   Additionally, I will write him out of work for today and tomorrow to give his back a chance to rest in the medications a chance to take effect.   Final Clinical Impressions(s) / UC Diagnoses   Final diagnoses:  Acute right-sided low back pain without sciatica  Discharge Instructions      Take the ibuprofen , 600 mg every 6 hours with food, on a schedule for the next 48 hours and then as needed.  Take the baclofen , 10 mg every 8 hours, on a schedule for the next 48 hours and then as needed.  Apply moist heat to your back for 30 minutes at a time 2-3 times a day to improve blood flow to the area and help remove the lactic acid causing the spasm.  Follow the back exercises given at discharge.  Return for reevaluation for any new or worsening symptoms.      ED Prescriptions     Medication Sig Dispense Auth. Provider   ibuprofen  (ADVIL ) 600 MG tablet Take 1 tablet (600 mg total) by mouth every 6 (six) hours as needed. 30 tablet Bernardino Ditch, NP   baclofen  (LIORESAL ) 10 MG tablet Take 1 tablet (10 mg total) by mouth 3 (three) times daily. 30 each Bernardino Ditch, NP      PDMP not reviewed this encounter.   Bernardino Ditch, NP 04/25/24 1131

## 2024-04-25 NOTE — Discharge Instructions (Addendum)
